# Patient Record
Sex: Female | Born: 1975 | Race: White | Hispanic: No | Marital: Married | Smoking: Former smoker
Health system: Southern US, Community
[De-identification: ages and names within clinical notes are randomized; demographics above are authoritative.]

## PROBLEM LIST (undated history)

## (undated) DIAGNOSIS — R51 Headache: Secondary | ICD-10-CM

## (undated) DIAGNOSIS — G4730 Sleep apnea, unspecified: Secondary | ICD-10-CM

## (undated) DIAGNOSIS — G8929 Other chronic pain: Secondary | ICD-10-CM

## (undated) DIAGNOSIS — M545 Low back pain, unspecified: Secondary | ICD-10-CM

## (undated) DIAGNOSIS — I82409 Acute embolism and thrombosis of unspecified deep veins of unspecified lower extremity: Secondary | ICD-10-CM

## (undated) DIAGNOSIS — K219 Gastro-esophageal reflux disease without esophagitis: Secondary | ICD-10-CM

## (undated) DIAGNOSIS — K754 Autoimmune hepatitis: Secondary | ICD-10-CM

## (undated) DIAGNOSIS — D6861 Antiphospholipid syndrome: Secondary | ICD-10-CM

## (undated) DIAGNOSIS — N926 Irregular menstruation, unspecified: Secondary | ICD-10-CM

## (undated) HISTORY — DX: Irregular menstruation, unspecified: N92.6

## (undated) HISTORY — DX: Sleep apnea, unspecified: G47.30

## (undated) HISTORY — DX: Headache: R51

## (undated) HISTORY — DX: Other chronic pain: G89.29

## (undated) HISTORY — DX: Low back pain: M54.5

## (undated) HISTORY — DX: Morbid (severe) obesity due to excess calories: E66.01

## (undated) HISTORY — DX: Autoimmune hepatitis: K75.4

## (undated) HISTORY — DX: Gastro-esophageal reflux disease without esophagitis: K21.9

## (undated) HISTORY — DX: Antiphospholipid syndrome: D68.61

## (undated) HISTORY — DX: Acute embolism and thrombosis of unspecified deep veins of unspecified lower extremity: I82.409

## (undated) HISTORY — DX: Low back pain, unspecified: M54.50

---

## 1999-08-27 ENCOUNTER — Emergency Department (HOSPITAL_COMMUNITY): Admission: EM | Admit: 1999-08-27 | Discharge: 1999-08-27 | Payer: Self-pay

## 2000-01-30 ENCOUNTER — Emergency Department (HOSPITAL_COMMUNITY): Admission: EM | Admit: 2000-01-30 | Discharge: 2000-01-31 | Payer: Self-pay | Admitting: Internal Medicine

## 2000-04-03 ENCOUNTER — Other Ambulatory Visit: Admission: RE | Admit: 2000-04-03 | Discharge: 2000-04-03 | Payer: Self-pay | Admitting: Obstetrics and Gynecology

## 2000-04-04 ENCOUNTER — Other Ambulatory Visit: Admission: RE | Admit: 2000-04-04 | Discharge: 2000-04-04 | Payer: Self-pay | Admitting: Obstetrics and Gynecology

## 2000-04-04 ENCOUNTER — Encounter (INDEPENDENT_AMBULATORY_CARE_PROVIDER_SITE_OTHER): Payer: Self-pay

## 2000-06-27 ENCOUNTER — Encounter: Admission: RE | Admit: 2000-06-27 | Discharge: 2000-06-27 | Payer: Self-pay | Admitting: Gastroenterology

## 2000-06-27 ENCOUNTER — Encounter: Payer: Self-pay | Admitting: Gastroenterology

## 2000-08-05 ENCOUNTER — Ambulatory Visit (HOSPITAL_COMMUNITY): Admission: RE | Admit: 2000-08-05 | Discharge: 2000-08-05 | Payer: Self-pay | Admitting: Gastroenterology

## 2000-08-23 ENCOUNTER — Ambulatory Visit (HOSPITAL_COMMUNITY): Admission: RE | Admit: 2000-08-23 | Discharge: 2000-08-23 | Payer: Self-pay | Admitting: Gastroenterology

## 2000-08-23 ENCOUNTER — Encounter (INDEPENDENT_AMBULATORY_CARE_PROVIDER_SITE_OTHER): Payer: Self-pay | Admitting: *Deleted

## 2000-08-23 ENCOUNTER — Encounter: Payer: Self-pay | Admitting: Gastroenterology

## 2000-12-06 ENCOUNTER — Encounter: Admission: RE | Admit: 2000-12-06 | Discharge: 2000-12-06 | Payer: Self-pay | Admitting: Gastroenterology

## 2000-12-06 ENCOUNTER — Encounter: Payer: Self-pay | Admitting: Gastroenterology

## 2001-03-26 ENCOUNTER — Other Ambulatory Visit: Admission: RE | Admit: 2001-03-26 | Discharge: 2001-03-26 | Payer: Self-pay | Admitting: Obstetrics and Gynecology

## 2002-04-28 ENCOUNTER — Other Ambulatory Visit: Admission: RE | Admit: 2002-04-28 | Discharge: 2002-04-28 | Payer: Self-pay | Admitting: Obstetrics and Gynecology

## 2003-02-09 ENCOUNTER — Other Ambulatory Visit: Admission: RE | Admit: 2003-02-09 | Discharge: 2003-02-09 | Payer: Self-pay | Admitting: Obstetrics and Gynecology

## 2003-08-02 ENCOUNTER — Encounter: Payer: Self-pay | Admitting: Internal Medicine

## 2003-08-02 ENCOUNTER — Encounter: Admission: RE | Admit: 2003-08-02 | Discharge: 2003-08-02 | Payer: Self-pay | Admitting: Internal Medicine

## 2003-08-18 ENCOUNTER — Encounter: Payer: Self-pay | Admitting: Gastroenterology

## 2003-08-18 ENCOUNTER — Encounter: Admission: RE | Admit: 2003-08-18 | Discharge: 2003-08-18 | Payer: Self-pay | Admitting: Gastroenterology

## 2003-09-30 ENCOUNTER — Encounter: Payer: Self-pay | Admitting: Family Medicine

## 2003-09-30 ENCOUNTER — Encounter: Admission: RE | Admit: 2003-09-30 | Discharge: 2003-09-30 | Payer: Self-pay | Admitting: Family Medicine

## 2004-07-17 ENCOUNTER — Inpatient Hospital Stay (HOSPITAL_COMMUNITY): Admission: EM | Admit: 2004-07-17 | Discharge: 2004-07-19 | Payer: Self-pay | Admitting: Internal Medicine

## 2004-11-15 ENCOUNTER — Ambulatory Visit: Payer: Self-pay | Admitting: Internal Medicine

## 2005-01-19 ENCOUNTER — Ambulatory Visit: Payer: Self-pay | Admitting: Internal Medicine

## 2005-03-16 ENCOUNTER — Ambulatory Visit: Payer: Self-pay | Admitting: Oncology

## 2005-03-19 ENCOUNTER — Ambulatory Visit: Payer: Self-pay | Admitting: Internal Medicine

## 2005-03-26 ENCOUNTER — Ambulatory Visit: Payer: Self-pay | Admitting: Internal Medicine

## 2005-06-01 ENCOUNTER — Ambulatory Visit: Payer: Self-pay | Admitting: Internal Medicine

## 2005-06-08 ENCOUNTER — Ambulatory Visit: Payer: Self-pay | Admitting: Internal Medicine

## 2005-06-11 ENCOUNTER — Ambulatory Visit: Payer: Self-pay | Admitting: Oncology

## 2005-06-13 ENCOUNTER — Other Ambulatory Visit: Admission: RE | Admit: 2005-06-13 | Discharge: 2005-06-13 | Payer: Self-pay | Admitting: Obstetrics and Gynecology

## 2005-09-10 ENCOUNTER — Ambulatory Visit: Payer: Self-pay | Admitting: Internal Medicine

## 2005-09-11 ENCOUNTER — Encounter: Admission: RE | Admit: 2005-09-11 | Discharge: 2005-09-11 | Payer: Self-pay | Admitting: Family Medicine

## 2005-09-21 ENCOUNTER — Ambulatory Visit: Payer: Self-pay | Admitting: Oncology

## 2005-11-12 ENCOUNTER — Ambulatory Visit: Payer: Self-pay | Admitting: Internal Medicine

## 2005-11-27 ENCOUNTER — Encounter: Admission: RE | Admit: 2005-11-27 | Discharge: 2005-11-27 | Payer: Self-pay | Admitting: Gastroenterology

## 2005-12-19 ENCOUNTER — Ambulatory Visit: Payer: Self-pay | Admitting: Internal Medicine

## 2006-03-06 ENCOUNTER — Ambulatory Visit: Payer: Self-pay | Admitting: Internal Medicine

## 2006-03-15 ENCOUNTER — Encounter: Admission: RE | Admit: 2006-03-15 | Discharge: 2006-03-15 | Payer: Self-pay | Admitting: Internal Medicine

## 2006-03-27 ENCOUNTER — Ambulatory Visit: Payer: Self-pay | Admitting: Internal Medicine

## 2006-04-08 ENCOUNTER — Ambulatory Visit: Payer: Self-pay | Admitting: Oncology

## 2006-04-15 LAB — CBC WITH DIFFERENTIAL/PLATELET
BASO%: 0.7 % (ref 0.0–2.0)
EOS%: 1.6 % (ref 0.0–7.0)
HCT: 38.5 % (ref 34.8–46.6)
MCH: 29.1 pg (ref 26.0–34.0)
MCHC: 34 g/dL (ref 32.0–36.0)
MONO#: 0.6 10*3/uL (ref 0.1–0.9)
NEUT%: 66.6 % (ref 39.6–76.8)
RDW: 16.4 % — ABNORMAL HIGH (ref 11.3–14.5)
WBC: 6.5 10*3/uL (ref 3.9–10.0)
lymph#: 1.5 10*3/uL (ref 0.9–3.3)

## 2006-04-15 LAB — IRON AND TIBC
TIBC: 345 ug/dL (ref 250–470)
UIBC: 315 ug/dL

## 2006-07-08 ENCOUNTER — Ambulatory Visit: Payer: Self-pay | Admitting: Internal Medicine

## 2006-07-16 ENCOUNTER — Ambulatory Visit: Payer: Self-pay | Admitting: Family Medicine

## 2006-07-22 ENCOUNTER — Ambulatory Visit: Payer: Self-pay | Admitting: Internal Medicine

## 2006-09-24 ENCOUNTER — Ambulatory Visit: Payer: Self-pay | Admitting: Internal Medicine

## 2006-10-04 ENCOUNTER — Ambulatory Visit: Payer: Self-pay | Admitting: Oncology

## 2006-10-14 ENCOUNTER — Ambulatory Visit: Payer: Self-pay | Admitting: Cardiology

## 2006-10-16 ENCOUNTER — Ambulatory Visit: Payer: Self-pay | Admitting: Internal Medicine

## 2006-10-21 ENCOUNTER — Ambulatory Visit: Payer: Self-pay | Admitting: Internal Medicine

## 2006-10-22 ENCOUNTER — Ambulatory Visit: Payer: Self-pay | Admitting: Internal Medicine

## 2006-10-24 ENCOUNTER — Ambulatory Visit: Payer: Self-pay | Admitting: Internal Medicine

## 2006-10-28 ENCOUNTER — Ambulatory Visit: Payer: Self-pay | Admitting: Internal Medicine

## 2007-01-12 DIAGNOSIS — D6861 Antiphospholipid syndrome: Secondary | ICD-10-CM

## 2007-01-12 DIAGNOSIS — K219 Gastro-esophageal reflux disease without esophagitis: Secondary | ICD-10-CM

## 2007-06-02 ENCOUNTER — Ambulatory Visit: Payer: Self-pay | Admitting: Internal Medicine

## 2007-06-02 LAB — CONVERTED CEMR LAB: INR: 1.7

## 2007-06-16 ENCOUNTER — Ambulatory Visit: Payer: Self-pay | Admitting: Internal Medicine

## 2007-06-16 LAB — CONVERTED CEMR LAB: INR: 5.5

## 2007-06-26 ENCOUNTER — Ambulatory Visit: Payer: Self-pay | Admitting: Internal Medicine

## 2007-06-26 DIAGNOSIS — I82409 Acute embolism and thrombosis of unspecified deep veins of unspecified lower extremity: Secondary | ICD-10-CM

## 2007-08-29 ENCOUNTER — Telehealth (INDEPENDENT_AMBULATORY_CARE_PROVIDER_SITE_OTHER): Payer: Self-pay | Admitting: *Deleted

## 2007-12-05 ENCOUNTER — Ambulatory Visit: Payer: Self-pay | Admitting: Internal Medicine

## 2007-12-05 LAB — CONVERTED CEMR LAB: INR: 1.2

## 2007-12-16 ENCOUNTER — Ambulatory Visit: Payer: Self-pay | Admitting: Internal Medicine

## 2007-12-16 LAB — CONVERTED CEMR LAB: INR: 2.6

## 2008-01-02 ENCOUNTER — Ambulatory Visit: Payer: Self-pay | Admitting: Internal Medicine

## 2008-01-16 ENCOUNTER — Ambulatory Visit: Payer: Self-pay | Admitting: Internal Medicine

## 2008-02-13 ENCOUNTER — Telehealth (INDEPENDENT_AMBULATORY_CARE_PROVIDER_SITE_OTHER): Payer: Self-pay | Admitting: *Deleted

## 2008-02-27 ENCOUNTER — Ambulatory Visit: Payer: Self-pay | Admitting: Internal Medicine

## 2008-03-08 ENCOUNTER — Telehealth (INDEPENDENT_AMBULATORY_CARE_PROVIDER_SITE_OTHER): Payer: Self-pay | Admitting: *Deleted

## 2008-03-23 ENCOUNTER — Ambulatory Visit: Payer: Self-pay | Admitting: Internal Medicine

## 2008-03-25 ENCOUNTER — Telehealth (INDEPENDENT_AMBULATORY_CARE_PROVIDER_SITE_OTHER): Payer: Self-pay | Admitting: *Deleted

## 2008-03-25 LAB — CONVERTED CEMR LAB: INR: 1.2 — ABNORMAL HIGH (ref 0.8–1.0)

## 2008-04-01 ENCOUNTER — Ambulatory Visit: Payer: Self-pay | Admitting: Internal Medicine

## 2008-04-02 ENCOUNTER — Telehealth (INDEPENDENT_AMBULATORY_CARE_PROVIDER_SITE_OTHER): Payer: Self-pay | Admitting: *Deleted

## 2008-04-04 LAB — CONVERTED CEMR LAB
INR: 3.3 — ABNORMAL HIGH (ref 0.8–1.0)
Prothrombin Time: 23.3 s — ABNORMAL HIGH (ref 10.9–13.3)

## 2008-04-13 ENCOUNTER — Ambulatory Visit: Payer: Self-pay | Admitting: Internal Medicine

## 2008-04-15 ENCOUNTER — Telehealth (INDEPENDENT_AMBULATORY_CARE_PROVIDER_SITE_OTHER): Payer: Self-pay | Admitting: *Deleted

## 2008-04-15 LAB — CONVERTED CEMR LAB: Prothrombin Time: 20.9 s — ABNORMAL HIGH (ref 11.6–15.2)

## 2008-04-26 ENCOUNTER — Ambulatory Visit: Payer: Self-pay | Admitting: Internal Medicine

## 2008-04-27 ENCOUNTER — Telehealth (INDEPENDENT_AMBULATORY_CARE_PROVIDER_SITE_OTHER): Payer: Self-pay | Admitting: *Deleted

## 2008-04-27 LAB — CONVERTED CEMR LAB: INR: 7.8 (ref 0.8–1.0)

## 2008-04-30 ENCOUNTER — Telehealth (INDEPENDENT_AMBULATORY_CARE_PROVIDER_SITE_OTHER): Payer: Self-pay | Admitting: *Deleted

## 2008-04-30 ENCOUNTER — Encounter (INDEPENDENT_AMBULATORY_CARE_PROVIDER_SITE_OTHER): Payer: Self-pay | Admitting: *Deleted

## 2008-04-30 ENCOUNTER — Ambulatory Visit: Payer: Self-pay | Admitting: Internal Medicine

## 2008-04-30 LAB — CONVERTED CEMR LAB
INR: 1.3 — ABNORMAL HIGH (ref 0.8–1.0)
Prothrombin Time: 15 s — ABNORMAL HIGH (ref 10.9–13.3)

## 2008-05-07 ENCOUNTER — Encounter (INDEPENDENT_AMBULATORY_CARE_PROVIDER_SITE_OTHER): Payer: Self-pay | Admitting: *Deleted

## 2008-05-07 ENCOUNTER — Ambulatory Visit: Payer: Self-pay | Admitting: Cardiology

## 2008-05-07 ENCOUNTER — Telehealth (INDEPENDENT_AMBULATORY_CARE_PROVIDER_SITE_OTHER): Payer: Self-pay | Admitting: *Deleted

## 2008-05-21 ENCOUNTER — Telehealth (INDEPENDENT_AMBULATORY_CARE_PROVIDER_SITE_OTHER): Payer: Self-pay | Admitting: *Deleted

## 2008-05-21 ENCOUNTER — Ambulatory Visit: Payer: Self-pay | Admitting: Internal Medicine

## 2008-05-21 LAB — CONVERTED CEMR LAB
INR: 1.8 — ABNORMAL HIGH (ref 0.8–1.0)
Prothrombin Time: 20.3 s — ABNORMAL HIGH (ref 10.9–13.3)

## 2008-10-01 ENCOUNTER — Telehealth (INDEPENDENT_AMBULATORY_CARE_PROVIDER_SITE_OTHER): Payer: Self-pay | Admitting: *Deleted

## 2008-10-11 ENCOUNTER — Ambulatory Visit: Payer: Self-pay | Admitting: Internal Medicine

## 2008-10-12 ENCOUNTER — Telehealth (INDEPENDENT_AMBULATORY_CARE_PROVIDER_SITE_OTHER): Payer: Self-pay | Admitting: *Deleted

## 2008-10-12 LAB — CONVERTED CEMR LAB
INR: 3.6 — ABNORMAL HIGH (ref 0.8–1.0)
Prothrombin Time: 37.4 s — ABNORMAL HIGH (ref 10.9–13.3)

## 2008-10-27 ENCOUNTER — Ambulatory Visit: Payer: Self-pay | Admitting: Internal Medicine

## 2008-10-29 ENCOUNTER — Telehealth: Payer: Self-pay | Admitting: Internal Medicine

## 2008-10-29 LAB — CONVERTED CEMR LAB: Prothrombin Time: 22.7 s — ABNORMAL HIGH (ref 10.9–13.3)

## 2008-11-13 ENCOUNTER — Inpatient Hospital Stay (HOSPITAL_COMMUNITY): Admission: AD | Admit: 2008-11-13 | Discharge: 2008-11-13 | Payer: Self-pay | Admitting: Obstetrics and Gynecology

## 2008-11-15 ENCOUNTER — Telehealth (INDEPENDENT_AMBULATORY_CARE_PROVIDER_SITE_OTHER): Payer: Self-pay | Admitting: *Deleted

## 2008-11-18 ENCOUNTER — Ambulatory Visit: Payer: Self-pay | Admitting: Internal Medicine

## 2008-11-18 ENCOUNTER — Telehealth: Payer: Self-pay | Admitting: Family Medicine

## 2008-11-19 ENCOUNTER — Telehealth: Payer: Self-pay | Admitting: Internal Medicine

## 2008-11-19 LAB — CONVERTED CEMR LAB
INR: 1.2 — ABNORMAL HIGH (ref 0.8–1.0)
Prothrombin Time: 14.5 s — ABNORMAL HIGH (ref 10.9–13.3)

## 2008-11-30 HISTORY — PX: DILATION AND CURETTAGE OF UTERUS: SHX78

## 2008-12-03 ENCOUNTER — Encounter (INDEPENDENT_AMBULATORY_CARE_PROVIDER_SITE_OTHER): Payer: Self-pay | Admitting: Obstetrics and Gynecology

## 2008-12-03 ENCOUNTER — Ambulatory Visit: Payer: Self-pay | Admitting: Internal Medicine

## 2008-12-03 ENCOUNTER — Ambulatory Visit (HOSPITAL_COMMUNITY): Admission: RE | Admit: 2008-12-03 | Discharge: 2008-12-03 | Payer: Self-pay | Admitting: Obstetrics and Gynecology

## 2009-01-05 ENCOUNTER — Telehealth (INDEPENDENT_AMBULATORY_CARE_PROVIDER_SITE_OTHER): Payer: Self-pay | Admitting: *Deleted

## 2009-01-14 ENCOUNTER — Ambulatory Visit: Payer: Self-pay | Admitting: Internal Medicine

## 2009-01-14 ENCOUNTER — Telehealth (INDEPENDENT_AMBULATORY_CARE_PROVIDER_SITE_OTHER): Payer: Self-pay | Admitting: *Deleted

## 2009-01-14 ENCOUNTER — Encounter: Admission: RE | Admit: 2009-01-14 | Discharge: 2009-01-14 | Payer: Self-pay | Admitting: Gastroenterology

## 2009-01-17 ENCOUNTER — Telehealth: Payer: Self-pay | Admitting: Internal Medicine

## 2009-01-18 ENCOUNTER — Telehealth (INDEPENDENT_AMBULATORY_CARE_PROVIDER_SITE_OTHER): Payer: Self-pay | Admitting: *Deleted

## 2009-01-21 ENCOUNTER — Ambulatory Visit: Payer: Self-pay | Admitting: Internal Medicine

## 2009-01-21 DIAGNOSIS — M549 Dorsalgia, unspecified: Secondary | ICD-10-CM | POA: Insufficient documentation

## 2009-01-28 ENCOUNTER — Encounter: Payer: Self-pay | Admitting: Internal Medicine

## 2009-02-01 ENCOUNTER — Encounter: Payer: Self-pay | Admitting: Internal Medicine

## 2009-02-01 ENCOUNTER — Telehealth: Payer: Self-pay | Admitting: Internal Medicine

## 2009-02-04 ENCOUNTER — Encounter: Payer: Self-pay | Admitting: Internal Medicine

## 2009-02-04 ENCOUNTER — Encounter: Admission: RE | Admit: 2009-02-04 | Discharge: 2009-02-04 | Payer: Self-pay | Admitting: Gastroenterology

## 2009-02-25 ENCOUNTER — Encounter: Payer: Self-pay | Admitting: Internal Medicine

## 2009-03-02 ENCOUNTER — Telehealth: Payer: Self-pay | Admitting: Internal Medicine

## 2009-03-04 ENCOUNTER — Ambulatory Visit: Payer: Self-pay | Admitting: Internal Medicine

## 2009-03-04 ENCOUNTER — Telehealth (INDEPENDENT_AMBULATORY_CARE_PROVIDER_SITE_OTHER): Payer: Self-pay | Admitting: *Deleted

## 2009-03-04 LAB — CONVERTED CEMR LAB: Prothrombin Time: 16.2 s — ABNORMAL HIGH (ref 10.9–13.3)

## 2009-03-10 ENCOUNTER — Telehealth (INDEPENDENT_AMBULATORY_CARE_PROVIDER_SITE_OTHER): Payer: Self-pay | Admitting: *Deleted

## 2009-03-18 ENCOUNTER — Ambulatory Visit: Payer: Self-pay | Admitting: Internal Medicine

## 2009-03-18 LAB — CONVERTED CEMR LAB: INR: 4.2

## 2009-04-22 ENCOUNTER — Ambulatory Visit: Payer: Self-pay | Admitting: Internal Medicine

## 2009-04-22 LAB — CONVERTED CEMR LAB: INR: 3.5

## 2009-05-13 ENCOUNTER — Ambulatory Visit: Payer: Self-pay | Admitting: Internal Medicine

## 2009-05-13 LAB — CONVERTED CEMR LAB
INR: 3.6
Prothrombin Time: 22.8 s

## 2009-05-23 ENCOUNTER — Telehealth: Payer: Self-pay | Admitting: Internal Medicine

## 2009-05-23 ENCOUNTER — Encounter (INDEPENDENT_AMBULATORY_CARE_PROVIDER_SITE_OTHER): Payer: Self-pay | Admitting: *Deleted

## 2009-05-27 ENCOUNTER — Ambulatory Visit: Payer: Self-pay | Admitting: Internal Medicine

## 2009-05-30 ENCOUNTER — Ambulatory Visit: Payer: Self-pay | Admitting: Diagnostic Radiology

## 2009-05-30 ENCOUNTER — Encounter: Payer: Self-pay | Admitting: Emergency Medicine

## 2009-05-30 ENCOUNTER — Telehealth: Payer: Self-pay | Admitting: Family Medicine

## 2009-05-30 ENCOUNTER — Ambulatory Visit: Payer: Self-pay | Admitting: Endocrinology

## 2009-05-30 ENCOUNTER — Inpatient Hospital Stay (HOSPITAL_COMMUNITY): Admission: EM | Admit: 2009-05-30 | Discharge: 2009-06-01 | Payer: Self-pay | Admitting: Internal Medicine

## 2009-05-31 ENCOUNTER — Encounter (INDEPENDENT_AMBULATORY_CARE_PROVIDER_SITE_OTHER): Payer: Self-pay | Admitting: Internal Medicine

## 2009-05-31 ENCOUNTER — Ambulatory Visit: Payer: Self-pay | Admitting: Vascular Surgery

## 2009-05-31 ENCOUNTER — Telehealth: Payer: Self-pay | Admitting: Endocrinology

## 2009-05-31 ENCOUNTER — Encounter: Payer: Self-pay | Admitting: Endocrinology

## 2009-06-01 ENCOUNTER — Telehealth (INDEPENDENT_AMBULATORY_CARE_PROVIDER_SITE_OTHER): Payer: Self-pay | Admitting: *Deleted

## 2009-06-01 ENCOUNTER — Encounter: Payer: Self-pay | Admitting: Endocrinology

## 2009-06-03 ENCOUNTER — Ambulatory Visit: Payer: Self-pay | Admitting: Internal Medicine

## 2009-06-03 LAB — CONVERTED CEMR LAB: Prothrombin Time: 16.4 s

## 2009-06-10 ENCOUNTER — Ambulatory Visit: Payer: Self-pay | Admitting: Internal Medicine

## 2009-06-17 ENCOUNTER — Encounter: Payer: Self-pay | Admitting: Internal Medicine

## 2009-06-28 ENCOUNTER — Encounter: Payer: Self-pay | Admitting: Internal Medicine

## 2009-07-01 ENCOUNTER — Ambulatory Visit: Payer: Self-pay | Admitting: Internal Medicine

## 2009-07-01 LAB — CONVERTED CEMR LAB
INR: 3.1
Prothrombin Time: 21.2 s

## 2009-07-06 ENCOUNTER — Encounter: Payer: Self-pay | Admitting: Internal Medicine

## 2009-07-08 ENCOUNTER — Encounter: Payer: Self-pay | Admitting: Internal Medicine

## 2009-07-29 ENCOUNTER — Ambulatory Visit: Payer: Self-pay | Admitting: Internal Medicine

## 2009-07-29 DIAGNOSIS — R5381 Other malaise: Secondary | ICD-10-CM | POA: Insufficient documentation

## 2009-07-29 DIAGNOSIS — R079 Chest pain, unspecified: Secondary | ICD-10-CM

## 2009-07-29 DIAGNOSIS — R5383 Other fatigue: Secondary | ICD-10-CM

## 2009-07-29 LAB — CONVERTED CEMR LAB: Beta hcg, urine, semiquantitative: NEGATIVE

## 2009-08-12 ENCOUNTER — Telehealth: Payer: Self-pay | Admitting: Internal Medicine

## 2009-08-12 ENCOUNTER — Encounter: Payer: Self-pay | Admitting: Cardiology

## 2009-08-15 ENCOUNTER — Encounter (INDEPENDENT_AMBULATORY_CARE_PROVIDER_SITE_OTHER): Payer: Self-pay | Admitting: *Deleted

## 2009-08-22 ENCOUNTER — Encounter: Payer: Self-pay | Admitting: Internal Medicine

## 2009-08-26 ENCOUNTER — Ambulatory Visit: Payer: Self-pay | Admitting: Internal Medicine

## 2009-08-29 ENCOUNTER — Encounter: Payer: Self-pay | Admitting: Internal Medicine

## 2009-09-12 ENCOUNTER — Encounter: Payer: Self-pay | Admitting: Internal Medicine

## 2009-09-16 ENCOUNTER — Ambulatory Visit: Payer: Self-pay | Admitting: Internal Medicine

## 2009-09-16 LAB — CONVERTED CEMR LAB: INR: 3.1

## 2009-09-30 ENCOUNTER — Ambulatory Visit: Payer: Self-pay | Admitting: Internal Medicine

## 2009-09-30 LAB — CONVERTED CEMR LAB
INR: 4.4
Prothrombin Time: 25.5 s

## 2009-11-04 ENCOUNTER — Ambulatory Visit: Payer: Self-pay | Admitting: Internal Medicine

## 2009-11-04 LAB — CONVERTED CEMR LAB: INR: 4.3

## 2009-11-23 ENCOUNTER — Ambulatory Visit: Payer: Self-pay | Admitting: Internal Medicine

## 2009-11-23 LAB — CONVERTED CEMR LAB: Prothrombin Time: 16.4 s

## 2009-12-26 ENCOUNTER — Ambulatory Visit: Payer: Self-pay | Admitting: Internal Medicine

## 2009-12-27 ENCOUNTER — Telehealth (INDEPENDENT_AMBULATORY_CARE_PROVIDER_SITE_OTHER): Payer: Self-pay | Admitting: *Deleted

## 2010-01-02 ENCOUNTER — Ambulatory Visit: Payer: Self-pay | Admitting: Internal Medicine

## 2010-01-02 LAB — CONVERTED CEMR LAB
INR: 4.2
Prothrombin Time: 24.7 s

## 2010-01-16 ENCOUNTER — Ambulatory Visit: Payer: Self-pay | Admitting: Internal Medicine

## 2010-01-20 ENCOUNTER — Ambulatory Visit: Payer: Self-pay | Admitting: Cardiology

## 2010-01-27 ENCOUNTER — Ambulatory Visit: Payer: Self-pay | Admitting: Internal Medicine

## 2010-01-27 LAB — CONVERTED CEMR LAB: POC INR: 1.8

## 2010-02-03 ENCOUNTER — Ambulatory Visit: Payer: Self-pay | Admitting: Internal Medicine

## 2010-02-03 LAB — CONVERTED CEMR LAB: POC INR: 3.6

## 2010-02-10 ENCOUNTER — Ambulatory Visit: Payer: Self-pay | Admitting: Internal Medicine

## 2010-02-10 LAB — CONVERTED CEMR LAB: POC INR: 1.8

## 2010-02-24 ENCOUNTER — Ambulatory Visit: Payer: Self-pay | Admitting: Cardiology

## 2010-02-24 LAB — CONVERTED CEMR LAB: POC INR: 2.1

## 2010-03-08 ENCOUNTER — Telehealth: Payer: Self-pay | Admitting: Internal Medicine

## 2010-03-27 ENCOUNTER — Telehealth (INDEPENDENT_AMBULATORY_CARE_PROVIDER_SITE_OTHER): Payer: Self-pay | Admitting: *Deleted

## 2010-03-31 ENCOUNTER — Ambulatory Visit: Payer: Self-pay | Admitting: Internal Medicine

## 2010-04-28 ENCOUNTER — Ambulatory Visit: Payer: Self-pay | Admitting: Cardiology

## 2010-05-15 ENCOUNTER — Ambulatory Visit: Payer: Self-pay | Admitting: Cardiovascular Disease

## 2010-05-15 LAB — CONVERTED CEMR LAB: POC INR: 2.6

## 2010-06-02 ENCOUNTER — Ambulatory Visit: Payer: Self-pay | Admitting: Internal Medicine

## 2010-06-02 ENCOUNTER — Ambulatory Visit: Payer: Self-pay | Admitting: Cardiovascular Disease

## 2010-06-02 DIAGNOSIS — N939 Abnormal uterine and vaginal bleeding, unspecified: Secondary | ICD-10-CM

## 2010-06-02 DIAGNOSIS — N926 Irregular menstruation, unspecified: Secondary | ICD-10-CM | POA: Insufficient documentation

## 2010-06-02 DIAGNOSIS — R109 Unspecified abdominal pain: Secondary | ICD-10-CM | POA: Insufficient documentation

## 2010-06-02 DIAGNOSIS — D649 Anemia, unspecified: Secondary | ICD-10-CM

## 2010-06-02 LAB — CONVERTED CEMR LAB
Basophils Relative: 0.7 % (ref 0.0–3.0)
Blood in Urine, dipstick: NEGATIVE
Eosinophils Relative: 1.9 % (ref 0.0–5.0)
HCT: 29.1 % — ABNORMAL LOW (ref 36.0–46.0)
Ketones, urine, test strip: NEGATIVE
Lymphs Abs: 1.5 10*3/uL (ref 0.7–4.0)
MCV: 83.1 fL (ref 78.0–100.0)
Monocytes Absolute: 0.5 10*3/uL (ref 0.1–1.0)
Neutro Abs: 3.1 10*3/uL (ref 1.4–7.7)
Nitrite: NEGATIVE
Protein, U semiquant: NEGATIVE
RBC: 3.5 M/uL — ABNORMAL LOW (ref 3.87–5.11)
Specific Gravity, Urine: 1.025
Urobilinogen, UA: 0.2
WBC: 5.2 10*3/uL (ref 4.5–10.5)

## 2010-06-05 ENCOUNTER — Ambulatory Visit: Payer: Self-pay | Admitting: Internal Medicine

## 2010-06-05 ENCOUNTER — Telehealth (INDEPENDENT_AMBULATORY_CARE_PROVIDER_SITE_OTHER): Payer: Self-pay | Admitting: *Deleted

## 2010-06-05 ENCOUNTER — Telehealth: Payer: Self-pay | Admitting: Internal Medicine

## 2010-06-05 LAB — CONVERTED CEMR LAB
Eosinophils Relative: 2.4 % (ref 0.0–5.0)
Folate: 9 ng/mL
HCT: 28.8 % — ABNORMAL LOW (ref 36.0–46.0)
INR: 1.6 — ABNORMAL HIGH (ref 0.8–1.0)
Lymphs Abs: 1.3 10*3/uL (ref 0.7–4.0)
Monocytes Relative: 8.6 % (ref 3.0–12.0)
OCCULT 3: POSITIVE
Platelets: 214 10*3/uL (ref 150.0–400.0)
Prothrombin Time: 17.2 s — ABNORMAL HIGH (ref 9.7–11.8)
Vitamin B-12: 774 pg/mL (ref 211–911)
WBC: 5.5 10*3/uL (ref 4.5–10.5)

## 2010-06-06 ENCOUNTER — Encounter: Payer: Self-pay | Admitting: Internal Medicine

## 2010-06-09 ENCOUNTER — Ambulatory Visit: Payer: Self-pay | Admitting: Cardiovascular Disease

## 2010-06-09 ENCOUNTER — Encounter: Payer: Self-pay | Admitting: Cardiovascular Disease

## 2010-06-09 ENCOUNTER — Ambulatory Visit: Payer: Self-pay | Admitting: Internal Medicine

## 2010-06-12 LAB — CONVERTED CEMR LAB
Albumin: 2.7 g/dL — ABNORMAL LOW (ref 3.5–5.2)
Basophils Relative: 0.7 % (ref 0.0–3.0)
Bilirubin, Direct: 0.2 mg/dL (ref 0.0–0.3)
Eosinophils Relative: 1.8 % (ref 0.0–5.0)
Hemoglobin: 10 g/dL — ABNORMAL LOW (ref 12.0–15.0)
Lymphocytes Relative: 25.5 % (ref 12.0–46.0)
Monocytes Relative: 9.6 % (ref 3.0–12.0)
Neutro Abs: 4.4 10*3/uL (ref 1.4–7.7)
RBC: 3.59 M/uL — ABNORMAL LOW (ref 3.87–5.11)
Total Protein: 9.2 g/dL — ABNORMAL HIGH (ref 6.0–8.3)
WBC: 7.1 10*3/uL (ref 4.5–10.5)

## 2010-06-13 ENCOUNTER — Encounter: Admission: RE | Admit: 2010-06-13 | Discharge: 2010-06-13 | Payer: Self-pay | Admitting: Internal Medicine

## 2010-06-16 ENCOUNTER — Ambulatory Visit: Payer: Self-pay | Admitting: Cardiology

## 2010-07-07 ENCOUNTER — Ambulatory Visit: Payer: Self-pay | Admitting: Cardiovascular Disease

## 2010-07-07 LAB — CONVERTED CEMR LAB: POC INR: 2.3

## 2010-07-17 ENCOUNTER — Telehealth: Payer: Self-pay | Admitting: Internal Medicine

## 2010-07-21 ENCOUNTER — Ambulatory Visit (HOSPITAL_COMMUNITY): Admission: RE | Admit: 2010-07-21 | Discharge: 2010-07-21 | Payer: Self-pay | Admitting: Obstetrics and Gynecology

## 2010-08-04 ENCOUNTER — Ambulatory Visit: Payer: Self-pay | Admitting: Cardiovascular Disease

## 2010-08-04 LAB — CONVERTED CEMR LAB: POC INR: 3.2

## 2010-08-25 ENCOUNTER — Ambulatory Visit: Payer: Self-pay | Admitting: Cardiology

## 2010-09-15 ENCOUNTER — Ambulatory Visit: Payer: Self-pay | Admitting: Internal Medicine

## 2010-09-15 ENCOUNTER — Telehealth: Payer: Self-pay | Admitting: Internal Medicine

## 2010-09-15 LAB — CONVERTED CEMR LAB: POC INR: 2.7

## 2010-10-13 ENCOUNTER — Ambulatory Visit: Payer: Self-pay | Admitting: Cardiology

## 2010-10-13 LAB — CONVERTED CEMR LAB: POC INR: 1.5

## 2010-10-24 ENCOUNTER — Telehealth: Payer: Self-pay | Admitting: Internal Medicine

## 2010-10-27 ENCOUNTER — Ambulatory Visit: Payer: Self-pay | Admitting: Cardiovascular Disease

## 2010-11-20 ENCOUNTER — Ambulatory Visit: Payer: Self-pay | Admitting: Cardiovascular Disease

## 2010-11-20 ENCOUNTER — Telehealth: Payer: Self-pay | Admitting: Internal Medicine

## 2010-11-27 ENCOUNTER — Telehealth: Payer: Self-pay | Admitting: Internal Medicine

## 2010-12-08 ENCOUNTER — Telehealth: Payer: Self-pay | Admitting: Internal Medicine

## 2010-12-08 ENCOUNTER — Ambulatory Visit: Payer: Self-pay | Admitting: Internal Medicine

## 2011-01-04 ENCOUNTER — Telehealth: Payer: Self-pay | Admitting: Internal Medicine

## 2011-01-05 ENCOUNTER — Ambulatory Visit: Admission: RE | Admit: 2011-01-05 | Discharge: 2011-01-05 | Payer: Self-pay | Source: Home / Self Care

## 2011-01-05 LAB — CONVERTED CEMR LAB: POC INR: 2.4

## 2011-01-28 LAB — CONVERTED CEMR LAB
AST: 42 units/L
Calcium: 8.6 mg/dL
Glucose, Bld: 111 mg/dL
HCT: 35.3 %
Hemoglobin: 11.5 g/dL
MCV: 83.6 fL
RBC count: 4.23 10*6/uL
Total Protein: 8.5 g/dL
WBC, blood: 6.6 10*3/uL

## 2011-02-01 NOTE — Progress Notes (Signed)
Summary: refill  Phone Note Refill Request Message from:  Patient  Refills Requested: Medication #1:  OXYCODONE HCL 5 MG CAPS as needed   Last Refilled: 05/04/2010  Method Requested: Pick up at Office Initial call taken by: Army Fossa CMA,  July 17, 2010 2:57 PM  Follow-up for Phone Call        ok 60, no RF Kiing Deakin E. Shaydon Lease MD  July 17, 2010 4:11 PM     Prescriptions: OXYCODONE HCL 5 MG CAPS (OXYCODONE HCL) as needed  #60 x 0   Entered by:   Army Fossa CMA   Authorized by:   Nolon Rod. Cobi Delph MD   Signed by:   Army Fossa CMA on 07/17/2010   Method used:   Print then Give to Patient   RxID:   [id number]

## 2011-02-01 NOTE — Progress Notes (Signed)
Summary: PT/INR Results/ Lab Results  Phone Note Outgoing Call Call back at Home Phone [phone]   Call placed by: Shonna Chock,  June 05, 2010 4:42 PM Call placed to: Patient Summary of Call: Patient left messag on VM requesting lab results because she was holding coumadin over the weekend and needs to know what to do. I reviewed chart and no results avaliable.  I called [facility] and spoke with [facility] and she said they are still running patient's labs trying to get her b12 level to register. I asked if the PT/INR was avaliable. Hope said the PT/INR was 17.2/1.6  Follow-up for Phone Call        Per Dr.Hopper have patient take a 5mg  tab tonight and futher instruction will follow once other labs avaliable Follow-up by: Shonna Chock,  June 05, 2010 4:53 PM  Additional Follow-up for Phone Call Additional follow up Details #1::        Anemia is stable ; surprisingly the iron levels are norma.Stool cards are positive; GI consult indicated. INR 1.6; stay on 5 mg & recheck PT/INR 6/10 with CBC.Gyn assessment of the prolonged menses should be completed in addition to GI evaluation. Additional Follow-up by: [name] MD,  June 06, 2010 5:40 AM    Additional Follow-up for Phone Call Additional follow up Details #2::    DISCUSS WITH PATIENT appt schedule to recheck CBC and pt/inr............Marland KitchenFelecia Deloach CMA  June 06, 2010 8:27 AM   please schedule a GI consult, DX positive Hemoccults, patient on Coumadin Jose E. Paz MD  June 07, 2010 4:31 PM   PT ALREADY SCHEDULE FOR GI CONSULT ON 06-06-10..................Marland KitchenFelecia Deloach CMA  June 07, 2010 4:45 PM   Prescriptions: COUMADIN 5 MG TABS (WARFARIN SODIUM) as directed Brand medically necessary #135 x 0   Entered by:   Shonna Chock   Authorized by:   [name] MD   Signed by:   Shonna Chock on 06/05/2010   Method used:   Electronically to        [facility] [phone]* (retail)       [address]       [place], [place]  [address]       Ph: [phone]       Fax: [phone]   RxID:   [id number]

## 2011-02-01 NOTE — Medication Information (Signed)
Summary: rov/ln  Anticoagulant Therapy  Managed by: Bethena Midget, RN, BSN PCP: Drue Novel MD, Leane Platt MD: Eden Emms MD, Theron Arista Indication 1: Deep venous thrombosis Lab Used: [facility] [place] Site: [address] INR POC 3.2 INR RANGE 2.0-3.0  Dietary changes: no    Health status changes: no    Bleeding/hemorrhagic complications: no    Recent/future hospitalizations: no    Any changes in medication regimen? no    Recent/future dental: no  Any missed doses?: no       Is patient compliant with meds? yes       Allergies: No Known Drug Allergies  Anticoagulation Management History:      The patient is taking warfarin and comes in today for a routine follow up visit.  Positive risk factors for bleeding include presence of serious comorbidities.  Negative risk factors for bleeding include an age less than 65 years old.  The bleeding index is 'intermediate risk'.  Negative CHADS2 values include Age > 11 years old.  Her last INR was 1.4.  Anticoagulation responsible provider: Eden Emms MD, Theron Arista.  INR POC: 3.2.  Cuvette Lot#: [id number].  Exp: 10/2011.    Anticoagulation Management Assessment/Plan:      The patient's current anticoagulation dose is Coumadin 5 mg tabs: as directed.  The target INR is 2.0-3.0.  The next INR is due 08/25/2010.  Anticoagulation instructions were given to patient.  Results were reviewed/authorized by Bethena Midget, RN, BSN.  She was notified by Bethena Midget, RN, BSN.         Prior Anticoagulation Instructions: INR 2.3  Continue same regimen of 1.5 tabs every day.  Recheck in 4 weeks.     Current Anticoagulation Instructions: INR 3.2  Skip today then change dose to 7.5mg s daily except 5mg s on Wednesdays.  Recheck in 3 weeks.

## 2011-02-01 NOTE — Medication Information (Signed)
Summary: rov/sp  Anticoagulant Therapy  Managed by: Elaina Pattee, PharmD PCP: Drue Novel MD, Leane Platt MD: Juanda Chance MD, Pamila Mendibles Indication 1: Deep venous thrombosis Lab Used: [facility] [place] Site: [address] INR POC 2.1 INR RANGE 2.0-3.0  Dietary changes: no    Health status changes: no    Bleeding/hemorrhagic complications: yes       Details: Blood noted in stool after BM, which is described as a few drops.    Recent/future hospitalizations: no    Any changes in medication regimen? no    Recent/future dental: no  Any missed doses?: no       Is patient compliant with meds? yes      Comments: No bleeding source has been identified.  Allergies: No Known Drug Allergies  Anticoagulation Management History:      The patient is taking warfarin and comes in today for a routine follow up visit.  Positive risk factors for bleeding include presence of serious comorbidities.  Negative risk factors for bleeding include an age less than 39 years old.  The bleeding index is 'intermediate risk'.  Negative CHADS2 values include Age > 38 years old.  Her last INR was 1.4.  Anticoagulation responsible provider: Juanda Chance MD, Smitty Cords.  INR POC: 2.1.  Cuvette Lot#: [id number].  Exp: 08/2011.    Anticoagulation Management Assessment/Plan:      The patient's current anticoagulation dose is Coumadin 5 mg tabs: as directed.  The target INR is 2.0-3.0.  The next INR is due 06/27/2010.  Anticoagulation instructions were given to patient.  Results were reviewed/authorized by Elaina Pattee, PharmD.  She was notified by Elaina Pattee, PharmD.         Prior Anticoagulation Instructions: INR 1.4  Start new dose of 1 1/2 tablets daily. Repeat INR in 1 week.   Current Anticoagulation Instructions: INR 2.1. Take 1.5 tablets daily. Recheck in 2 weeks.

## 2011-02-01 NOTE — Medication Information (Signed)
Summary: rov/tm  Anticoagulant Therapy  Managed by: Weston Brass, PharmD PCP: Drue Novel MD, Leane Platt MD: Clifton [name] MD, Cristal Deer Indication 1: Deep venous thrombosis Lab Used: [facility] [place] Site: [address] INR POC 2.6 INR RANGE 2.0-3.0  Dietary changes: no    Health status changes: no    Bleeding/hemorrhagic complications: yes       Details: heavy menstrual bleeding more frequent menses than normal.Patient in touch with OBGYN  Recent/future hospitalizations: no    Any changes in medication regimen? no    Recent/future dental: no  Any missed doses?: no       Is patient compliant with meds? yes       Allergies: No Known Drug Allergies  Anticoagulation Management History:      The patient is taking warfarin and comes in today for a routine follow up visit.  Negative risk factors for bleeding include an age less than 3 years old.  The bleeding index is 'low risk'.  Negative CHADS2 values include Age > 24 years old.  Her last INR was 4.2.  Anticoagulation responsible provider: Clifton [name] MD, Cristal Deer.  INR POC: 2.6.  Cuvette Lot#: [id number].  Exp: 08/2011.    Anticoagulation Management Assessment/Plan:      The patient's current anticoagulation dose is Coumadin 5 mg tabs: as directed.  The target INR is 2.0-3.0.  The next INR is due 06/02/2010.  Anticoagulation instructions were given to patient.  Results were reviewed/authorized by Weston Brass, PharmD.  She was notified by Al[facility] Dad [facility].         Prior Anticoagulation Instructions: INR 4.4 Skip today. Then resume 10mg  daily except 7.5mg s on Wednesdays. Recheck in 2 weeks.   Current Anticoagulation Instructions: INR-2.6 Resume normal schedule, Take 1,5 tablets on Wednesdays and 2 tablets on all other days of the week. Return in 3 weeks.

## 2011-02-01 NOTE — Medication Information (Signed)
Summary: ROV/SP  Anticoagulant Therapy  Managed by: Elaina Pattee, PharmD PCP: Drue Novel MD, Leane Platt MD: Excell Seltzer MD, Casimiro Needle Indication 1: Deep venous thrombosis Lab Used: [facility] [place] Site: [address] INR POC 6.6 INR RANGE 2.0-3.0  Dietary changes: no    Health status changes: yes       Details: Complains of dizziness and blurry vision.  Will call PCP.    Bleeding/hemorrhagic complications: yes       Details: Black stools for 3 weeks.  Drawing CBC as well.  Recent/future hospitalizations: no    Any changes in medication regimen? no    Recent/future dental: yes     Details: Pt will have tooth pulled soon.  No need to hold Coumadin.  Any missed doses?: no       Is patient compliant with meds? yes      Comments: Sent pt to lab.  Also drew CBC. Called pt on cell and notified her that her H&H was low and INR=7.6. Advised to contact PCP and be seen today.  She will call back if this is not possible.   Allergies: No Known Drug Allergies  Anticoagulation Management History:      Negative risk factors for bleeding include an age less than 39 years old.  The bleeding index is 'low risk'.  Negative CHADS2 values include Age > 46 years old.  Her last INR was 4.2.  Anticoagulation responsible provider: Excell Seltzer MD, Casimiro Needle.  INR POC: 6.6.  Exp: 08/2011.    Anticoagulation Management Assessment/Plan:      The patient's current anticoagulation dose is Coumadin 5 mg tabs: as directed.  The target INR is 2.0-3.0.  The next INR is due 06/02/2010.  Anticoagulation instructions were given to patient.  Results were reviewed/authorized by Elaina Pattee, PharmD.  She was notified by Elaina Pattee, PharmD.         Prior Anticoagulation Instructions: INR-2.6 Resume normal schedule, Take 1,5 tablets on Wednesdays and 2 tablets on all other days of the week. Return in 3 weeks.  Current Anticoagulation Instructions: INR 6.6. Go to lab to have PT/INR drawn.  Will call this  afternoon with further instructions.  Call at [phone] (cell) or [phone] (work).  Appt Monday for recheck.  Hold Coumadin until then.  12:40 [place]

## 2011-02-01 NOTE — Medication Information (Signed)
Summary: rov/ewj  Anticoagulant Therapy  Managed by: Elaina Pattee, PharmD PCP: Drue Novel MD, Leane Platt MD: Johney Frame MD, Fayrene Fearing Indication 1: Deep venous thrombosis Lab Used: [facility] [place] Site: [address] INR POC 3.6 INR RANGE 2.0-3.0  Dietary changes: no    Health status changes: no    Bleeding/hemorrhagic complications: no    Recent/future hospitalizations: no    Any changes in medication regimen? no    Recent/future dental: no  Any missed doses?: no       Is patient compliant with meds? yes       Allergies: No Known Drug Allergies  Anticoagulation Management History:      The patient is taking warfarin and comes in today for a routine follow up visit.  Negative risk factors for bleeding include an age less than 83 years old.  The bleeding index is 'low risk'.  Negative CHADS2 values include Age > 2 years old.  Her last INR was 4.2.  Anticoagulation responsible provider: Mauricio Dahlen MD, Fayrene Fearing.  INR POC: 3.6.  Cuvette Lot#: [id number].  Exp: 04/2011.    Anticoagulation Management Assessment/Plan:      The patient's current anticoagulation dose is Coumadin 5 mg tabs: as directed.  The target INR is 2.0-3.0.  The next INR is due 02/10/2010.  Anticoagulation instructions were given to patient.  Results were reviewed/authorized by Elaina Pattee, PharmD.  She was notified by Elaina Pattee, PharmD.         Prior Anticoagulation Instructions: INR 1.8  Take 12.5mg  today then start taking 10mg  daily except 7.5mg  on Wednesdays.  Recheck in 1 week.    Current Anticoagulation Instructions: INR 3.6. Hold today, then continue 2 tablets daily except 1.5 on Wed.

## 2011-02-01 NOTE — Progress Notes (Signed)
Summary: Prescribing error  Phone Note Call from Patient Call back at Home Phone [phone]   Summary of Call: I spoke with the patient about Oxy prescription, we did prepare the prescription with the wrong qty and the patient did not notice until she had already picked up the med and returned home. I made her aware to call the office when that prescription has been completed and we can provide the proper qty on next prescription. Patient expressed understanding. Initial call taken by: Lucious Groves CMA,  November 27, 2010 11:36 AM

## 2011-02-01 NOTE — Medication Information (Signed)
Summary: rov/ewj   Anticoagulant Therapy  Managed by: Weston Brass, PharmD PCP: Drue Novel MD, Leane Platt MD: Gala Romney MD, Reuel Boom Indication 1: Deep venous thrombosis Lab Used: [facility] [place] Site: [address] INR POC 2.8 INR RANGE 2.0-3.0  Dietary changes: no    Health status changes: no    Bleeding/hemorrhagic complications: no    Recent/future hospitalizations: no    Any changes in medication regimen? no    Recent/future dental: no  Any missed doses?: no       Is patient compliant with meds? yes       Allergies: No Known Drug Allergies  Anticoagulation Management History:      The patient is taking warfarin and comes in today for a routine follow up visit.  Positive risk factors for bleeding include presence of serious comorbidities.  Negative risk factors for bleeding include an age less than 43 years old.  The bleeding index is 'intermediate risk'.  Negative CHADS2 values include Age > 22 years old.  Her last INR was 1.4.  Anticoagulation responsible Artha Chiasson: Bensimhon MD, Reuel Boom.  INR POC: 2.8.  Cuvette Lot#: [id number].  Exp: 11/2011.    Anticoagulation Management Assessment/Plan:      The patient's current anticoagulation dose is Coumadin 5 mg tabs: as directed.  The target INR is 2.0-3.0.  The next INR is due 11/17/2010.  Anticoagulation instructions were given to patient.  Results were reviewed/authorized by Weston Brass, PharmD.  She was notified by Haynes Hoehn, PharmD Candidate.         Prior Anticoagulation Instructions: INR 1.5  Take 10mg  today and tomorrow, then resume same dosage 1.5 tablets daily except 1 tablet on Wednesdays.  Recheck in 2 weeks.    Current Anticoagulation Instructions: INR 2.8  Continue Coumadin as scheduled:  1.5 tablets every day of the week, except 1 tableton Wednesday.  Return to clinic in 3 weeks.

## 2011-02-01 NOTE — Assessment & Plan Note (Signed)
Summary: pt/inr/swh   Nurse Visit   Allergies: No Known Drug Allergies  Impression & Recommendations:  Problem # 1:  ENCOUNTER FOR THERAPEUTIC DRUG MONITORING (ICD-V58.83) patient unwilling to go to the coumadin clinic will insist the she goes to a coumadin clinic (at [facility] or elsewhere) as I have not been able to "regulate" her If she continue to refuse, I may need to discharge her from my care  Orders: Protime ([id number]) Fingerstick (45409)  discussed with pt -- she will be willing to go back to [place] coumadin clinic. Margaret [name]  January 03, 2010 11:53 AM  Complete Medication List: 1)  Coumadin 5 Mg Tabs (Warfarin sodium) .... As directed 2)  Childrens Multivitamins Chew (Pediatric multiple vitamins) 3)  Baby Aspirin 81 Mg Chew (Aspirin) 4)  Azathioprine Tabs (Azathioprine tabs) .Marland Kitchen.. 125 mg 1 by mouth once daily 5)  Iron 25 Mg Tabs (Iron) 6)  Prilosec 20 Mg Cpdr (Omeprazole) .Marland Kitchen.. 1 by mouth once daily 7)  Flonase 50 Mcg/act Susp (Fluticasone propionate) .... 2 puffs once daily on each side of the nose 8)  Oxycodone Hcl 5 Mg Caps (Oxycodone hcl) .Marland Kitchen.. 1 by mouth two times a day  Other Orders: [facility] Referral (Coumadin clinic)  Laboratory Results   Blood Tests     PT: 24.7 s   (Normal Range: 10.6-13.4)  INR: 4.2   (Normal Range: 0.88-1.12   Therap INR: 2.0-3.5) Comments:  - RECHECKED 4.1, 24.5  - CURRENT DOSE - 5MG  tablets - 2 tablets daily except 2 1/2 tablets on tues & fri (65mg /wk)  - NEW DOSE - 5mg  tablets - 2 tablets daily except 1 1/2 on M, W, F (62.5mg /wk) Dr. Drue Novel would like for pt to go to coumadin clinic in 2 weeks.  Discussed with pt.  She states that she will NOT go to coumadin clinic.  She "DOES NOT LIKE THEM".  It is to far for her & they charge her a specialist co-pay & she "can't afford" that. Margaret [name]  January 02, 2010 11:24 AM see a/p Nolon Rod. Janequa Kipnis MD  January 02, 2010 5:58 PM     Orders Added: 1)  Protime [[id number]] 2)   Fingerstick [[id number]] 3)  [facility] Referral [Coumadin clinic] Prescriptions: OXYCODONE HCL 5 MG CAPS (OXYCODONE HCL) 1 by mouth two times a day  #60 x 0   Entered by:   Margaret [name]   Authorized by:   Nolon Rod. Agata Lucente MD   Signed by:   Margaret [name] on 01/02/2010   Method used:   Print then Give to Patient   RxID:   [id number]

## 2011-02-01 NOTE — Medication Information (Signed)
Summary: rov/sl  Anticoagulant Therapy  Managed by: Bethena Midget, RN, BSN PCP: Drue Novel MD, Leane Platt MD: Tenny Craw MD, Gunnar Fusi Indication 1: Deep venous thrombosis Lab Used: [facility] [place] Site: [address] INR POC 2.7 INR RANGE 2.0-3.0  Dietary changes: no    Health status changes: no    Bleeding/hemorrhagic complications: no    Recent/future hospitalizations: no    Any changes in medication regimen? yes       Details: OTC cold meds  Recent/future dental: no  Any missed doses?: no       Is patient compliant with meds? yes       Allergies: No Known Drug Allergies  Anticoagulation Management History:      The patient is taking warfarin and comes in today for a routine follow up visit.  Positive risk factors for bleeding include presence of serious comorbidities.  Negative risk factors for bleeding include an age less than 34 years old.  The bleeding index is 'intermediate risk'.  Negative CHADS2 values include Age > 84 years old.  Her last INR was 1.4.  Anticoagulation responsible provider: Tenny Craw MD, Gunnar Fusi.  INR POC: 2.7.  Cuvette Lot#: [id number].  Exp: 10/2011.    Anticoagulation Management Assessment/Plan:      The patient's current anticoagulation dose is Coumadin 5 mg tabs: as directed.  The target INR is 2.0-3.0.  The next INR is due 01/05/2011.  Anticoagulation instructions were given to patient.  Results were reviewed/authorized by Bethena Midget, RN, BSN.  She was notified by Bethena Midget, RN, BSN.         Prior Anticoagulation Instructions: INR 3.8  Today, Monday, November 21st, take Coumadin 0.5 tab (2.5 mg). Then, resume taking Coumadin 1.5 tabs on all days except for Coumadin 1 tab (5 mg) on Wednesdays. Return to clinic in 3 weeks.   Current Anticoagulation Instructions: INR 2.7 Continue 7.5mg s daily except 5mg s on Wednesdays. REcheck in 4 weeks.

## 2011-02-01 NOTE — Medication Information (Signed)
Summary: rov/cs   Anticoagulant Therapy  Managed by: Reina Fuse, PharmD PCP: Drue Novel MD, Leane Platt MD: Excell Seltzer MD, Casimiro Needle Indication 1: Deep venous thrombosis Lab Used: [facility] [place] Site: [address] INR POC 3.8 INR RANGE 2.0-3.0  Dietary changes: no    Health status changes: no    Bleeding/hemorrhagic complications: no    Recent/future hospitalizations: no    Any changes in medication regimen? no    Recent/future dental: no  Any missed doses?: no       Is patient compliant with meds? yes       Allergies: No Known Drug Allergies  Anticoagulation Management History:      The patient is taking warfarin and comes in today for a routine follow up visit.  Positive risk factors for bleeding include presence of serious comorbidities.  Negative risk factors for bleeding include an age less than 61 years old.  The bleeding index is 'intermediate risk'.  Negative CHADS2 values include Age > 38 years old.  Her last INR was 1.4.  Anticoagulation responsible Margaret [name]: Excell Seltzer MD, Casimiro Needle.  INR POC: 3.8.  Cuvette Lot#: [id number].  Exp: 11/2011.    Anticoagulation Management Assessment/Plan:      The patient's current anticoagulation dose is Coumadin 5 mg tabs: as directed.  The target INR is 2.0-3.0.  The next INR is due 12/08/2010.  Anticoagulation instructions were given to patient.  Results were reviewed/authorized by Reina Fuse, PharmD.  She was notified by Reina Fuse PharmD.         Prior Anticoagulation Instructions: INR 2.8  Continue Coumadin as scheduled:  1.5 tablets every day of the week, except 1 tableton Wednesday.  Return to clinic in 3 weeks.   Current Anticoagulation Instructions: INR 3.8  Today, Monday, November 21st, take Coumadin 0.5 tab (2.5 mg). Then, resume taking Coumadin 1.5 tabs on all days except for Coumadin 1 tab (5 mg) on Wednesdays. Return to clinic in 3 weeks.

## 2011-02-01 NOTE — Medication Information (Signed)
Summary: rov/eac  Anticoagulant Therapy  Managed by: Weston Brass, PharmD PCP: Drue Novel MD, Leane Platt MD: Tenny Craw MD, Gunnar Fusi Indication 1: Deep venous thrombosis Lab Used: [facility] [place] Site: [address] INR POC 2.2 INR RANGE 2.0-3.0  Dietary changes: no    Health status changes: yes       Details: was dizzy for several days over the past week; no issues now.  Also had numbness in R arm for  ~20 mins on day- only occured once  Bleeding/hemorrhagic complications: no    Recent/future hospitalizations: no    Any changes in medication regimen? no    Recent/future dental: no  Any missed doses?: no       Is patient compliant with meds? yes       Allergies: No Known Drug Allergies  Anticoagulation Management History:      The patient is taking warfarin and comes in today for a routine follow up visit.  Negative risk factors for bleeding include an age less than 43 years old.  The bleeding index is 'low risk'.  Negative CHADS2 values include Age > 50 years old.  Her last INR was 4.2.  Anticoagulation responsible provider: Tenny Craw MD, Gunnar Fusi.  INR POC: 2.2.  Cuvette Lot#: [id number].  Exp: 05/2011.    Anticoagulation Management Assessment/Plan:      The patient's current anticoagulation dose is Coumadin 5 mg tabs: as directed.  The target INR is 2.0-3.0.  The next INR is due 04/28/2010.  Anticoagulation instructions were given to patient.  Results were reviewed/authorized by Weston Brass, PharmD.  She was notified by Weston Brass PharmD.         Prior Anticoagulation Instructions: INR 2.1  Continue current dosing schedule of 2 tablets daily, except 1.5 tablets on Wednesday.  Return to clinic in 3 weeks.   Current Anticoagulation Instructions: INR 2.2  Continue same dose of 2 tablets every day except 1 1/2 tablets on Wednesday

## 2011-02-01 NOTE — Medication Information (Signed)
Summary: rov/cb  Anticoagulant Therapy  Managed by: Bethena Midget, RN, BSN PCP: Drue Novel MD, Leane Platt MD: Tenny Craw MD, Gunnar Fusi Indication 1: Deep venous thrombosis Lab Used: [facility] [place] Site: [address] INR POC 1.8 INR RANGE 2.0-3.0  Dietary changes: no    Health status changes: no    Bleeding/hemorrhagic complications: no    Recent/future hospitalizations: no    Any changes in medication regimen? no    Recent/future dental: no  Any missed doses?: no       Is patient compliant with meds? yes       Allergies: No Known Drug Allergies  Anticoagulation Management History:      The patient is taking warfarin and comes in today for a routine follow up visit.  Negative risk factors for bleeding include an age less than 31 years old.  The bleeding index is 'low risk'.  Negative CHADS2 values include Age > 48 years old.  Her last INR was 4.2.  Anticoagulation responsible provider: Tenny Craw MD, Gunnar Fusi.  INR POC: 1.8.  Cuvette Lot#: [id number].  Exp: 04/2011.    Anticoagulation Management Assessment/Plan:      The patient's current anticoagulation dose is Coumadin 5 mg tabs: as directed.  The target INR is 2.0-3.0.  The next INR is due 02/24/2010.  Anticoagulation instructions were given to patient.  Results were reviewed/authorized by Bethena Midget, RN, BSN.  She was notified by Bethena Midget, RN, BSN.         Prior Anticoagulation Instructions: INR 3.6. Hold today, then continue 2 tablets daily except 1.5 on Wed.   Current Anticoagulation Instructions: INR 1.8 Today take 12.5mg  Then resume 10mg s everyday except 7.5mg s on Wednesdays. Recheck in 2 weeks.

## 2011-02-01 NOTE — Assessment & Plan Note (Signed)
Summary: CBC and pt/inr      Allergies Added: NKDA Nurse Visit   Vital Signs:  Patient profile:   36 year old female Height:      59 inches Weight:      272 pounds Temp:     98.6 degrees F oral Pulse rate:   76 / minute BP sitting:   118 / 90  (left arm)  Vitals Entered By: Jeremy Johann CMA (June 09, 2010 10:14 AM)  History of Present Illness: here for a followup She started with right upper abdominal pain 2 and half weeks ago, pain is daily, steady, she is tender to touch. No change with food intake, no fever. Some nausea.  she was also found to have a very high INR, her Coumadin was held for a few days, she now restarted 5 mg of Coumadin daily for this ago.  She saw a GI doctor 06-06-10, they felt that she needed Benefiber, constipation?Marland Kitchen They are not planning further workup  She also saw her gynecologist due to heavy bleeding They are planning a sono histogram The like to daycare of day he be bleeding permanently, they say they need to coordinate any procedure  ( uterine ablation?) with hematology due to her tendency to clot.  The patient continued to have pain in the upper right abdomen and right flank. She denies actual back pain   Impression & Recommendations:  Problem # 1:  ABDOMINAL PAIN (ICD-789.00) I am concerned about the combination of elevated INR, acute anemia, right upper quadrant and right flank pain. Intra-abdominal bleed? Gallbladder? plan:  CT of the abdomen without contrast  to rule out bleeding----- patient's cell [phone] Ultrasound of the gallbladder ---------------------------CT no bleed, will proceed w/ u/s  Orders: TLB-Hepatic/Liver Function Pnl (80076-HEPATIC) Radiology Referral (Radiology) Radiology Referral (Radiology)  Problem # 2:  MENSTRUAL DISORDER (ICD-626.9) per gynecology, see history of present illness  Problem # 3:  ENCOUNTER FOR THERAPEUTIC DRUG MONITORING (ICD-V58.83) Will talk with the Coumadin clinic for further  advise-- done, spoke w/  Kennon Rounds  Orders: Protime ([id number])  Complete Medication List: 1)  Coumadin 5 Mg Tabs (Warfarin sodium) .... As directed 2)  Childrens Multivitamins Chew (Pediatric multiple vitamins) 3)  Baby Aspirin 81 Mg Chew (Aspirin) 4)  Azathioprine 50 Mg Tabs (Azathioprine) .... Take 2 1/2 tablets daily 5)  Iron 25 Mg Tabs (Iron) 6)  Prilosec 20 Mg Cpdr (Omeprazole) .... As needed 7)  Oxycodone Hcl 5 Mg Caps (Oxycodone hcl) .... As needed 8)  Astepro 0.15 % Soln (Azelastine hcl) .... 2 sprays on each side of the nose two times a day as needed 9)  Tramadol Hcl 50 Mg Tabs (Tramadol hcl) .Marland Kitchen.. 1 every 6 hrs as needed pain  Other Orders: Venipuncture (19147) TLB-CBC Platelet - w/Differential (85025-CBCD)   Review of Systems      See HPI   Physical Exam  General:  alert, well-developed, and overweight-appearing.   Abdomen:  soft.  tender at the right upper quadrant and slightly at the right flank   CC: pt check    Allergies (verified): No Known Drug Allergies Laboratory Results   Blood Tests    Date/Time Reported: June 09, 2010 10:15 AM   INR: 1.4   (Normal Range: 0.88-1.12   Therap INR: 2.0-3.5) Comments: current dose 5mg  , 1 tab once daily ....Marland KitchenMarland KitchenFelecia Deloach CMA  June 09, 2010 10:15 AM     Orders Added: 1)  Venipuncture [82956] 2)  TLB-Hepatic/Liver Function Pnl [80076-HEPATIC] 3)  TLB-CBC Platelet -  w/Differential [85025-CBCD] 4)  Protime [[id number]] 5)  Radiology Referral [Radiology] 6)  Radiology Referral [Radiology] 7)  Est. Patient Level III [59563]

## 2011-02-01 NOTE — Medication Information (Signed)
Summary: Coumadin Clinic  Anticoagulant Therapy  Managed by: Weston Brass, PharmD PCP: Drue Novel MD, Leane Platt MD: Clifton [name] MD, Cristal Deer Indication 1: Deep venous thrombosis Lab Used: [facility] [place] Site: [address] INR POC 1.4 INR RANGE 2.0-3.0  Dietary changes: no    Health status changes: no    Bleeding/hemorrhagic complications: yes       Details: pt was having blood in stools; reports this is resolving.  Tests have been negative so far for abdominal bleeding  Recent/future hospitalizations: no    Any changes in medication regimen? no    Recent/future dental: no  Any missed doses?: yes     Details: Pt has been holding Coumadin after supratherapeutic INR, blood in stools and abdominal pain.  Restarted Coumadin 5mg  on Tuesday   Is patient compliant with meds? yes      Comments: Lab checked by Dr. Drue Novel today.   Allergies: No Known Drug Allergies  Anticoagulation Management History:      Her anticoagulation is being managed by telephone today.  Positive risk factors for bleeding include presence of serious comorbidities.  Negative risk factors for bleeding include an age less than 48 years old.  The bleeding index is 'intermediate risk'.  Negative CHADS2 values include Age > 52 years old.  Her last INR was 1.6 ratio.  Anticoagulation responsible provider: Clifton [name] MD, Cristal Deer.  INR POC: 1.4.  Exp: 08/2011.    Anticoagulation Management Assessment/Plan:      The patient's current anticoagulation dose is Coumadin 5 mg tabs: as directed.  The target INR is 2.0-3.0.  The next INR is due 06/16/2010.  Anticoagulation instructions were given to patient.  Results were reviewed/authorized by Weston Brass, PharmD.  She was notified by Weston Brass PharmD.         Prior Anticoagulation Instructions: INR 6.6. Go to lab to have PT/INR drawn.  Will call this afternoon with further instructions.  Call at [phone] (cell) or [phone] (work).  Appt Monday for  recheck.  Hold Coumadin until then.  12:40 [place]oland  Current Anticoagulation Instructions: INR 1.4  Start new dose of 1 1/2 tablets daily. Repeat INR in 1 week.

## 2011-02-01 NOTE — Progress Notes (Signed)
Summary: gyn asap  ---- Converted from flag ---- ---- 06/05/2010 8:38 AM, Jose E. Paz MD wrote: Margaret [name], please schedule an appointment w/ gyn ASAP  ---- 06/04/2010 6:18 PM, [name] MD wrote: Margaret [name], she will have labs 06/06 here;please ask your Nurse to schedule Gyn F/U ASAP (see OV). Hopp ------------------------------  Spoke with pt she currently see dr Margaret [name]. Informed pt will call to schedule appt for her. per dr Margaret [name] office pt has a unpaid bill with them so they will not see pt until she pays it off. Advised pt of issue with scheduling appt. pt states that she will call them and straighting issue out and set up her appt with them asap.Felecia Deloach CMA  June 05, 2010 2:39 PM

## 2011-02-01 NOTE — Medication Information (Signed)
Summary: rov/sp  Medications Added COUMADIN 5 MG TABS (WARFARIN SODIUM) as directed       Anticoagulant Therapy  Managed by: Weston Brass, PharmD PCP: Drue Novel MD, Leane Platt MD: Jens Som MD, Arlys John Indication 1: Deep venous thrombosis Lab Used: [facility] [place] Site: [address] INR POC 2.4 INR RANGE 2.0-3.0  Dietary changes: no    Health status changes: no    Bleeding/hemorrhagic complications: no    Recent/future hospitalizations: no    Any changes in medication regimen? no    Recent/future dental: no  Any missed doses?: no       Is patient compliant with meds? yes       Allergies: No Known Drug Allergies  Anticoagulation Management History:      The patient is taking warfarin and comes in today for a routine follow up visit.  Positive risk factors for bleeding include presence of serious comorbidities.  Negative risk factors for bleeding include an age less than 79 years old.  The bleeding index is 'intermediate risk'.  Negative CHADS2 values include Age > 56 years old.  Her last INR was 1.4.  Anticoagulation responsible provider: Jens Som MD, Arlys John.  INR POC: 2.4.  Exp: 10/2011.    Anticoagulation Management Assessment/Plan:      The patient's current anticoagulation dose is Coumadin 5 mg tabs: as directed.  The target INR is 2.0-3.0.  The next INR is due 02/02/2011.  Anticoagulation instructions were given to patient.  Results were reviewed/authorized by Weston Brass, PharmD.         Prior Anticoagulation Instructions: INR 2.7 Continue 7.5mg s daily except 5mg s on Wednesdays. REcheck in 4 weeks.   Current Anticoagulation Instructions: INR 2.4 (INR goal:  2-3)  Continue current Coumadin regimen of 1 and 1/2 tablets on Sundays, Mondays, Tuesdays, Thursdays, Fridays, and Saturdays and 1 tablet on Wednesdays.   Prescriptions: COUMADIN 5 MG TABS (WARFARIN SODIUM) as directed  #30 x 3   Entered by:   Amanda Misiewicz PharmD   Authorized by:    Brian Saunders Crenshaw, MD, FACC   Signed by:   Amanda Misiewicz PharmD on 01/05/2011   Method used:   Electronically to        [facility] [address]* (retail)       [address]       [place], [place]  [address]       Ph: [phone] or [phone]       Fax: [phone]   RxID:   [id number]

## 2011-02-01 NOTE — Medication Information (Signed)
Summary: rov/sp  Anticoagulant Therapy  Managed by: Bethena Midget, RN, BSN PCP: Drue Novel MD, Leane Platt MD: Juanda Chance MD, Jazlynn Nemetz Indication 1: Deep venous thrombosis Lab Used: [facility] [place] Site: [address] INR POC 4.4 INR RANGE 2.0-3.0  Dietary changes: no    Health status changes: yes       Details: Pt. she has had a GI upset for past few days.   Bleeding/hemorrhagic complications: no    Recent/future hospitalizations: no    Any changes in medication regimen? no    Recent/future dental: no  Any missed doses?: no       Is patient compliant with meds? yes       Allergies: No Known Drug Allergies  Anticoagulation Management History:      The patient is taking warfarin and comes in today for a routine follow up visit.  Negative risk factors for bleeding include an age less than 28 years old.  The bleeding index is 'low risk'.  Negative CHADS2 values include Age > 92 years old.  Her last INR was 4.2.  Anticoagulation responsible provider: Juanda Chance MD, Smitty Cords.  INR POC: 4.4.  Cuvette Lot#: [id number].  Exp: 06/2011.    Anticoagulation Management Assessment/Plan:      The patient's current anticoagulation dose is Coumadin 5 mg tabs: as directed.  The target INR is 2.0-3.0.  The next INR is due 05/12/2010.  Anticoagulation instructions were given to patient.  Results were reviewed/authorized by Bethena Midget, RN, BSN.  She was notified by Bethena Midget, RN, BSN.         Prior Anticoagulation Instructions: INR 2.2  Continue same dose of 2 tablets every day except 1 1/2 tablets on Wednesday   Current Anticoagulation Instructions: INR 4.4 Skip today. Then resume 10mg  daily except 7.5mg s on Wednesdays. Recheck in 2 weeks.

## 2011-02-01 NOTE — Assessment & Plan Note (Signed)
Summary: [facility] PT--COUMIDIN # HIGH 7 OR 9; HEMOGLOBIN=9.SOMETHING--SAID .Marland KitchenMarland Kitchen   Vital Signs:  Patient profile:   36 year old female Weight:      276.2 pounds BMI:     55.99 O2 Sat:      97 % Temp:     98.6 degrees F oral Pulse rate:   109 / minute Resp:     16 per minute BP sitting:   124 / 86  (left arm) Cuff size:   large  Vitals Entered By: Shonna Chock (June 02, 2010 4:04 PM) CC: Patient here per Coumadin clinic-abnormal labs, patient is holding coumadin until Monday and then to follow-up with coumadin clinic Monday. Patient states" I feel tired, feels like Im here but not here.", Abdominal pain Comments REVIEWED MED LIST, PATIENT AGREED DOSE AND INSTRUCTION CORRECT    Primary Care Provider:  Drue Novel MD, [facility]  CC:  Patient here per Coumadin clinic-abnormal labs, patient is holding coumadin until Monday and then to follow-up with coumadin clinic Monday. Patient states" I feel tired, feels like Im here but not here.", and Abdominal pain.  History of Present Illness: PT/INR was 7.6 this am @ Coumadin Clinic ; H/H drawn to evaluate elicited history of  fatigue &  dark stools for several weeks. On coumadin for DVT ? X4(PMR lists 2) in LE in context of anticardiolipin antibodies.No FH of coagulopathy but patient is adopted. No  PMH of  GI disease.Also she has had some R flank area pain X 1&1/2 weeks; no evaluation to date of this." I told my husband about it".The patient reports nausea and anorexia, but denies vomiting, hematochezia, and hematemesis.  The location of the pain is right upper quadrant & has no triggers or relievers.  The pain is described as intermittent and burning in quality.  Associated symptoms include vaginal bleeding; menses lasted 3 weeks in April & 2 weeks in May.  The patient denies the following symptoms: fever, weight loss, dysuria, chest pain, jaundice, dark urine, and missed menstrual period although menses have been irregular.    Allergies (verified): No Known Drug  Allergies  Past History:  Past Medical History: DVT, hx of (1996) DVT, hx of (2000) GERD OBESITY, MORBID  ANTICARDIOLIPIN ANTIBODY SYNDROME  HEPATITIS AUTOINMUNE  Abnormal menses  Past Surgical History: D&C approx 11/2008  Review of Systems General:  Complains of chills and sweats; denies fever. Eyes:  Complains of blurring; denies double vision and vision loss-both eyes; No "blacking out of vision". ENT:  Complains of nosebleeds; denies difficulty swallowing and hoarseness. CV:  Denies chest pain or discomfort and shortness of breath with exertion. Resp:  Denies coughing up blood. GI:  Complains of indigestion; denies bloody stools. GU:  Denies hematuria. Derm:  Denies lesion(s) and rash. Heme:  Complains of abnormal bruising; denies bleeding; Menses are irregular & heavy; Dr Randa Evens has Rxed Prometrium.  Physical Exam  General:  in no acute distress; alert,appropriate and cooperative throughout examination Eyes:  No corneal or conjunctival inflammation noted. EOMI. Perrla. No jaundice Lungs:  Normal respiratory effort, chest expands symmetrically. Lungs are clear to auscultation, no crackles or wheezes. Heart:  normal rate, regular rhythm, no gallop, no rub, no JVD, no HJR, and grade 1 /6 systolic murmur.   Abdomen:  Bowel sounds positive,abdomen soft  but slightly tender  RUQ laterally without masses, organomegaly or hernias noted. Pulses:  R and L carotid,radial,dorsalis pedis and posterior tibial pulses are full and equal bilaterally Extremities:  No clubbing, cyanosis, edema. Skin:  Intact without suspicious lesions or rashes Cervical Nodes:  No lymphadenopathy noted Axillary Nodes:  No palpable lymphadenopathy Psych:  memory intact for recent and remote, normally interactive, and good eye contact.     Impression & Recommendations:  Problem # 1:  ANEMIA (ICD-285.9)  probably from abnormal menses Her updated medication list for this problem includes:    Iron 25 Mg  Tabs (Iron)  Orders: UA Dipstick w/o Micro (manual) (08657)  Problem # 2:  ABDOMINAL PAIN (ICD-789.00) R flank/ RUQ. UA normal  Problem # 3:  MENSTRUAL DISORDER (ICD-626.9) irregular,prolonged (lasting 2-3  weeks ) menses  Problem # 4:  ANTICARDIOLIPIN ANTIBODY SYNDROME (ICD-795.79)  Problem # 5:  DVT (ICD-453.40) PMH of  Complete Medication List: 1)  Coumadin 5 Mg Tabs (Warfarin sodium) .... As directed 2)  Childrens Multivitamins Chew (Pediatric multiple vitamins) 3)  Baby Aspirin 81 Mg Chew (Aspirin) 4)  Azathioprine 50 Mg Tabs (Azathioprine) .... Take 2 1/2 tablets daily 5)  Iron 25 Mg Tabs (Iron) 6)  Prilosec 20 Mg Cpdr (Omeprazole) .... As needed 7)  Oxycodone Hcl 5 Mg Caps (Oxycodone hcl) .... As needed 8)  Astepro 0.15 % Soln (Azelastine hcl) .... 2 sprays on each side of the nose two times a day as needed 9)  Tramadol Hcl 50 Mg Tabs (Tramadol hcl) .Marland Kitchen.. 1 every 6 hrs as needed pain  Patient Instructions: 1)  To ER if any acute bleeding occurs. The  etiology of your abnormal menses MUST be defined because of the need for coumadin. Please see your Gynecologist ASAP.Complete stool cards. Labs here Monday am : 2)  CBC w/ Diff ;PT/INR ; iron panel; B12/folate; hepatic panel; amylase; lipase; BMET.  Prescriptions: TRAMADOL HCL 50 MG TABS (TRAMADOL HCL) 1 every 6 hrs as needed pain  #30 x 0   Entered and Authorized by:   [name] MD   Signed by:   [name] MD on 06/02/2010   Method used:   Faxed to ...       [facility] [address]* (retail)       [address]       [place], [place]  [address]       Ph: [phone] or [phone]       Fax: [phone]   RxID:   [id number]   Laboratory Results   Urine Tests    Routine Urinalysis   Color: straw Appearance: Cloudy Glucose: negative   (Normal Range: Negative) Bilirubin: negative   (Normal Range: Negative) Ketone: negative   (Normal Range: Negative) Spec. Gravity: 1.025    (Normal Range: 1.003-1.035) Blood: negative   (Normal Range: Negative) pH: 6.0   (Normal Range: 5.0-8.0) Protein: negative   (Normal Range: Negative) Urobilinogen: 0.2   (Normal Range: 0-1) Nitrite: negative   (Normal Range: Negative) Leukocyte Esterace: negative   (Normal Range: Negative)

## 2011-02-01 NOTE — Medication Information (Signed)
Summary: DVT/antiphos antibiody/ewj  Anticoagulant Therapy  Managed by: Bethena Midget, RN, BSN PCP: Drue Novel MD, Leane Platt MD: Daleen Squibb MD, Maisie Fus Indication 1: Deep venous thrombosis Lab Used: [facility] [place] Site: [address] INR POC 1.6  Dietary changes: no    Health status changes: no    Bleeding/hemorrhagic complications: yes       Details: occ. nosebled.   Recent/future hospitalizations: no    Any changes in medication regimen? no    Recent/future dental: no  Any missed doses?: no       Is patient compliant with meds? yes      Comments: Pt. states she has been on coumadin since age 36yrs. Previous followed by Dr. Drue Novel. He was having trouble regulating thus now followed by [facility]. Present dose is 10mg s daily except 7.5mg s on MWF.   Current Medications (verified): 1)  Coumadin 5 Mg Tabs (Warfarin Sodium) .... As Directed 2)  Childrens Multivitamins  Chew (Pediatric Multiple Vitamins) 3)  Baby Aspirin 81 Mg Chew (Aspirin) 4)  Azathioprine 50 Mg Tabs (Azathioprine) .... Take 2 1/2 Tablets Daily 5)  Iron 25 Mg Tabs (Iron) 6)  Prilosec 20 Mg Cpdr (Omeprazole) .... As Needed 7)  Oxycodone Hcl 5 Mg Caps (Oxycodone Hcl) .... As Needed 8)  Cefprozil 500 Mg Tabs (Cefprozil) .... Two By Mouth Twice A Day For One Week 9)  Astepro 0.15 % Soln (Azelastine Hcl) .... 2 Sprays On Each Side of The Nose Two Times A Day 10)  Guaifenesin Ac 100-10 Mg/74ml Syrp (Guaifenesin-Codeine) .... One or 2 Teaspoons At Bedtime As Needed For  Severe Cough  Allergies (verified): No Known Drug Allergies  Anticoagulation Management History:      The patient comes in today for her initial visit for anticoagulation therapy.  Negative risk factors for bleeding include an age less than 35 years old.  The bleeding index is 'low risk'.  Negative CHADS2 values include Age > 35 years old.  Her last INR was 4.2.  Anticoagulation responsible provider: Daleen Squibb MD, Maisie Fus.  INR POC: 1.6.  Cuvette Lot#:  [id number].  Exp: 04/2011.    Anticoagulation Management Assessment/Plan:      The patient's current anticoagulation dose is Coumadin 5 mg tabs: as directed.  The next INR is due 01/27/2010.  Anticoagulation instructions were given to patient.  Results were reviewed/authorized by Bethena Midget, RN, BSN.  She was notified by Bethena Midget, RN, BSN.         Current Anticoagulation Instructions: INR 1.6 Continue dose to 10mg s everyday except 7.5mg s on Mondays and Wednesdays. recheck in one week.

## 2011-02-01 NOTE — Medication Information (Signed)
Summary: rov/tm  Anticoagulant Therapy  Managed by: Eda Keys, PharmD PCP: Drue Novel MD, Leane Platt MD: Jens Som MD, Arlys John Indication 1: Deep venous thrombosis Lab Used: [facility] [place] Site: [address] INR POC 2.1 INR RANGE 2.0-3.0  Dietary changes: no    Health status changes: no    Bleeding/hemorrhagic complications: yes       Details: 4 nosebleeds over the past 2 weeks as well as menstrual spotting  Recent/future hospitalizations: no    Any changes in medication regimen? no    Recent/future dental: no  Any missed doses?: no       Is patient compliant with meds? yes       Allergies: No Known Drug Allergies  Anticoagulation Management History:      The patient is taking warfarin and comes in today for a routine follow up visit.  Negative risk factors for bleeding include an age less than 68 years old.  The bleeding index is 'low risk'.  Negative CHADS2 values include Age > 29 years old.  Her last INR was 4.2.  Anticoagulation responsible provider: Jens Som MD, Arlys John.  INR POC: 2.1.  Cuvette Lot#: [id number].  Exp: 04/2011.    Anticoagulation Management Assessment/Plan:      The patient's current anticoagulation dose is Coumadin 5 mg tabs: as directed.  The target INR is 2.0-3.0.  The next INR is due 03/17/2010.  Anticoagulation instructions were given to patient.  Results were reviewed/authorized by Eda Keys, PharmD.  She was notified by Eda Keys.         Prior Anticoagulation Instructions: INR 1.8 Today take 12.5mg  Then resume 10mg s everyday except 7.5mg s on Wednesdays. Recheck in 2 weeks.   Current Anticoagulation Instructions: INR 2.1  Continue current dosing schedule of 2 tablets daily, except 1.5 tablets on Wednesday.  Return to clinic in 3 weeks.

## 2011-02-01 NOTE — Progress Notes (Signed)
Summary: Refill--Oxy rx  Phone Note Refill Request Call back at Home Phone [phone] Message from:  Patient on November 20, 2010 3:47 PM  Refills Requested: Medication #1:  OXYCODONE HCL 5 MG CAPS as needed   Last Refilled: 10/24/2010 Initial call taken by: Lucious Groves CMA,  November 20, 2010 3:47 PM  Follow-up for Phone Call        60, no RF Follow-up by: Nolon Rod. Paz MD,  November 20, 2010 5:05 PM  Additional Follow-up for Phone Call Additional follow up Details #1::        Patient notified prescription is up front and ready for pickup. Lucious Groves CMA  November 21, 2010 9:52 AM     Prescriptions: OXYCODONE HCL 5 MG CAPS (OXYCODONE HCL) as needed  #30 x 0   Entered by:   Lucious Groves CMA   Authorized by:   Nolon Rod. Paz MD   Signed by:   Lucious Groves CMA on 11/21/2010   Method used:   Printed then faxed to ...       [facility] [phone]* (retail)       [address]       [place], [place]  [address]       Ph: [phone]       Fax: [phone]   RxID:   [id number]

## 2011-02-01 NOTE — Consult Note (Signed)
Summary: [facility]   Imported By: Lanelle Bal 06/15/2010 08:58:59  _____________________________________________________________________  External Attachment:    Type:   Image     Comment:   External Document

## 2011-02-01 NOTE — Medication Information (Signed)
Summary: rov/ewj  Anticoagulant Therapy  Managed by: Cloyde Reams, RN, BSN PCP: Drue Novel MD, Leane Platt MD: Myrtis Ser MD, Tinnie Gens Indication 1: Deep venous thrombosis Lab Used: [facility] [place] Site: [address] INR POC 1.5 INR RANGE 2.0-3.0  Dietary changes: yes       Details: Decr appetite.   Health status changes: no    Bleeding/hemorrhagic complications: no    Recent/future hospitalizations: no    Any changes in medication regimen? no    Recent/future dental: no  Any missed doses?: yes     Details: Missed last night's dosage of Coumadin, needs refill on rx.    Is patient compliant with meds? yes       Allergies: No Known Drug Allergies  Anticoagulation Management History:      The patient is taking warfarin and comes in today for a routine follow up visit.  Positive risk factors for bleeding include presence of serious comorbidities.  Negative risk factors for bleeding include an age less than 52 years old.  The bleeding index is 'intermediate risk'.  Negative CHADS2 values include Age > 60 years old.  Her last INR was 1.4.  Anticoagulation responsible Dhaval Woo: Myrtis Ser MD, Tinnie Gens.  INR POC: 1.5.  Cuvette Lot#: [id number].  Exp: 11/2011.    Anticoagulation Management Assessment/Plan:      The patient's current anticoagulation dose is Coumadin 5 mg tabs: as directed.  The target INR is 2.0-3.0.  The next INR is due 10/27/2010.  Anticoagulation instructions were given to patient.  Results were reviewed/authorized by Cloyde Reams, RN, BSN.  She was notified by Cloyde Reams RN.         Prior Anticoagulation Instructions: INR 2.7  Continue on same dosage 7.5mg  daily except 5mg  on Wednesdays.  Recheck in 4 weeks.    Current Anticoagulation Instructions: INR 1.5  Take 10mg  today and tomorrow, then resume same dosage 1.5 tablets daily except 1 tablet on Wednesdays.  Recheck in 2 weeks.   Prescriptions: COUMADIN 5 MG TABS (WARFARIN SODIUM) as directed Brand  medically necessary #60 x 2   Entered by:   Cloyde Reams RN   Authorized by:   Talitha Givens, MD, [facility]   Signed by:   Cloyde Reams RN on 10/13/2010   Method used:   Electronically to        [facility] [address] (retail)       [address]       [place], [place]  [address]       Ph: [phone] or [phone]       Fax: [phone]   RxID:   [id number]

## 2011-02-01 NOTE — Assessment & Plan Note (Signed)
Summary: acute only, sinus infection/swh   Vital Signs:  Patient profile:   36 year old female Height:      59 inches Weight:      276 pounds Temp:     98 degrees F BP sitting:   128 / 80  Vitals Entered By: Shary Decamp (January 16, 2010 4:34 PM) CC: acute only Comments  - cold sxs x several weeks  - eyes matted in am  - green nasal d/c  - cough Shary Decamp  January 16, 2010 4:38 PM    History of Present Illness: sick for 3 weeks, see above   Current Medications (verified): 1)  Coumadin 5 Mg Tabs (Warfarin Sodium) .... As Directed 2)  Childrens Multivitamins  Chew (Pediatric Multiple Vitamins) 3)  Baby Aspirin 81 Mg Chew (Aspirin) 4)  Azathioprine  Tabs (Azathioprine Tabs) .Marland Kitchen.. 125 Mg 1 By Mouth Once Daily 5)  Iron 25 Mg Tabs (Iron) 6)  Prilosec 20 Mg Cpdr (Omeprazole) .Marland Kitchen.. 1 By Mouth Once Daily 7)  Flonase 50 Mcg/act  Susp (Fluticasone Propionate) .... 2 Puffs Once Daily On Each Side of The Nose 8)  Oxycodone Hcl 5 Mg Caps (Oxycodone Hcl) .Marland Kitchen.. 1 By Mouth Two Times A Day  Allergies (verified): No Known Drug Allergies  Past History:  Past Medical History: Reviewed history from 06/03/2009 and no changes required. DVT, hx of (1996) DVT, hx of (2000) GERD OBESITY, MORBID  ANTICARDIOLIPIN ANTIBODY SYNDROME  HEPATITIS AUTOINMUNE   Past Surgical History: Reviewed history from 07/29/2009 and no changes required. D&C aprox 12-09  Social History: Married one child quit tobacco in December 2010  Review of Systems       had fever only initially has  chest congestion and sputum, no hemoptysis  some difficulty sleeping due to cough  Physical Exam  General:  alert and overweight-appearing.   Head:  face symmetric, not tender to palpation in the sinus area Ears:  R ear normal and L ear normal.   Mouth:  no redness or discharge Lungs:  normal respiratory effort, no intercostal retractions, and no accessory muscle use. few rhonchi that cleared  with cough , no  distress Heart:  normal rate, regular rhythm, no murmur, and no gallop.     Impression & Recommendations:  Problem # 1:  BRONCHITIS- ACUTE (ICD-466.0)  Her updated medication list for this problem includes:    Cefprozil 500 Mg Tabs (Cefprozil) ..... One by mouth twice a day for one week    Guaifenesin Ac 100-10 Mg/13ml Syrp (Guaifenesin-codeine) ..... One or 2 teaspoons at bedtime as needed for  severe cough  Complete Medication List: 1)  Coumadin 5 Mg Tabs (Warfarin sodium) .... As directed 2)  Childrens Multivitamins Chew (Pediatric multiple vitamins) 3)  Baby Aspirin 81 Mg Chew (Aspirin) 4)  Azathioprine Tabs (Azathioprine tabs) .Marland Kitchen.. 125 mg 1 by mouth once daily 5)  Iron 25 Mg Tabs (Iron) 6)  Prilosec 20 Mg Cpdr (Omeprazole) .Marland Kitchen.. 1 by mouth once daily 7)  Flonase 50 Mcg/act Susp (Fluticasone propionate) .... 2 puffs once daily on each side of the nose 8)  Oxycodone Hcl 5 Mg Caps (Oxycodone hcl) .Marland Kitchen.. 1 by mouth two times a day 9)  Cefprozil 500 Mg Tabs (Cefprozil) .... One by mouth twice a day for one week 10)  Astepro 0.15 % Soln (Azelastine hcl) .... 2 sprays on each side of the nose two times a day 11)  Guaifenesin Ac 100-10 Mg/109ml Syrp (Guaifenesin-codeine) .... One or 2 teaspoons at bedtime  as needed for  severe cough  Patient Instructions: 1)  rest 2)  lots of fluids 3)  Robitussin-DM over-the-counter for cough 4)  codeine at night the cough is severe 5)  astepro as prescribed for nasal congestion 6)  antibiotics as prescribed (cefprozil) 7)  call if not better in a few days Prescriptions: GUAIFENESIN AC 100-10 MG/5ML SYRP (GUAIFENESIN-CODEINE) one or 2 teaspoons at bedtime as needed for  severe cough  #150cc x 0   Entered and Authorized by:   Elita Quick E. Niveah Boerner MD   Signed by:   Nolon Rod. Enyla Lisbon MD on 01/16/2010   Method used:   Print then Give to Patient   RxID:   [id number] ASTEPRO 0.15 % SOLN (AZELASTINE HCL) 2 sprays on each side of the nose two times a day  #1 x 1    Entered and Authorized by:   Elita Quick E. Joliet Mallozzi MD   Signed by:   Nolon Rod. Jassmin Kemmerer MD on 01/16/2010   Method used:   Print then Give to Patient   RxID:   [id number] CEFPROZIL 500 MG TABS (CEFPROZIL) one by mouth twice a day for one week  #14 x 0   Entered and Authorized by:   Nolon Rod. Ules Marsala MD   Signed by:   Nolon Rod. Kaelene Elliston MD on 01/16/2010   Method used:   Print then Give to Patient   RxID:   [id number]

## 2011-02-01 NOTE — Progress Notes (Signed)
Summary: Oxycodone Refill  Phone Note Refill Request Call back at Home Phone [phone] Message from:  Patient on October 24, 2010 3:44 PM  Refills Requested: Medication #1:  OXYCODONE HCL 5 MG CAPS as needed   Last Refilled: 09/15/2010  Method Requested: Pick up at Office Initial call taken by: Army Fossa CMA,  October 24, 2010 3:45 PM  Follow-up for Phone Call        ok  #60, no refills Follow-up by: [facility] E. Paz MD,  October 24, 2010 4:07 PM    Prescriptions: OXYCODONE HCL 5 MG CAPS (OXYCODONE HCL) as needed  #60 x 0   Entered by:   Army Fossa CMA   Authorized by:   Nolon Rod. Paz MD   Signed by:   Army Fossa CMA on 10/24/2010   Method used:   Print then Give to Patient   RxID:   [id number]

## 2011-02-01 NOTE — Progress Notes (Signed)
Summary: oxy refill  Phone Note Refill Request Message from:  Patient  Refills Requested: Medication #1:  OXYCODONE HCL 5 MG CAPS as needed   Last Refilled: 12/08/2010 will call when ready for pick up.    Method Requested: Pick up at Office Initial call taken by: Army Fossa CMA,  January 04, 2011 4:27 PM  Follow-up for Phone Call        60, no RF Follow-up by: Nolon Rod. Paz MD,  January 04, 2011 6:32 PM  Additional Follow-up for Phone Call Additional follow up Details #1::        Pt is aware. Army Fossa CMA  January 05, 2011 8:28 AM     Prescriptions: OXYCODONE HCL 5 MG CAPS (OXYCODONE HCL) as needed  #60 x 0   Entered by:   Army Fossa CMA   Authorized by:   Nolon Rod. Paz MD   Signed by:   Army Fossa CMA on 01/05/2011   Method used:   Print then Give to Patient   RxID:   [id number]

## 2011-02-01 NOTE — Progress Notes (Signed)
  Phone Note Call from Patient Call back at Work Phone [phone]   Caller: Patient Summary of Call: C/O DIZZINESS SINCE YESTERDAY OV SCHEDULED .Kandice Hams  March 27, 2010 12:29 PM  Initial call taken by: Kandice Hams,  March 27, 2010 12:29 PM

## 2011-02-01 NOTE — Progress Notes (Signed)
Summary: oxy refill  Phone Note Refill Request Message from:  Patient  Refills Requested: Medication #1:  OXYCODONE HCL 5 MG CAPS as needed Pt only received 30 pills last time rx was prescribed. Okay to give an rx for 30 pills? would like to pick up after lunch.  Initial call taken by: Army Fossa CMA,  December 08, 2010 8:23 AM  Follow-up for Phone Call        ok 60, no RF Jose E. Paz MD  December 08, 2010 1:03 PM     Prescriptions: OXYCODONE HCL 5 MG CAPS (OXYCODONE HCL) as needed  #60 x 0   Entered by:   Army Fossa CMA   Authorized by:   Nolon Rod. Paz MD   Signed by:   Army Fossa CMA on 12/08/2010   Method used:   Print then Give to Patient   RxID:   [id number]

## 2011-02-01 NOTE — Medication Information (Signed)
Summary: Margaret [name]  Anticoagulant Therapy  Managed by: Cloyde Reams, RN, BSN PCP: Drue Novel MD, Leane Platt MD: Tenny Craw MD, Gunnar Fusi Indication 1: Deep venous thrombosis Lab Used: [facility] [place] Site: [address] INR POC 2.7 INR RANGE 2.0-3.0  Dietary changes: no    Health status changes: no    Bleeding/hemorrhagic complications: no    Recent/future hospitalizations: no    Any changes in medication regimen? no    Recent/future dental: no  Any missed doses?: no       Is patient compliant with meds? yes       Allergies: No Known Drug Allergies  Anticoagulation Management History:      The patient is taking warfarin and comes in today for a routine follow up visit.  Positive risk factors for bleeding include presence of serious comorbidities.  Negative risk factors for bleeding include an age less than 49 years old.  The bleeding index is 'intermediate risk'.  Negative CHADS2 values include Age > 83 years old.  Her last INR was 1.4.  Anticoagulation responsible Dontez Hauss: Tenny Craw MD, Gunnar Fusi.  INR POC: 2.7.  Cuvette Lot#: [id number].  Exp: 10/2011.    Anticoagulation Management Assessment/Plan:      The patient's current anticoagulation dose is Coumadin 5 mg tabs: as directed.  The target INR is 2.0-3.0.  The next INR is due 10/13/2010.  Anticoagulation instructions were given to patient.  Results were reviewed/authorized by Cloyde Reams, RN, BSN.  She was notified by Cloyde Reams RN.         Prior Anticoagulation Instructions: INR 1.9  Take 2 tablets today, then resume same dosage 7.5mg  daily except 5mg  on Wednesdays.  Recheck in 3 weeks.    Current Anticoagulation Instructions: INR 2.7  Continue on same dosage 7.5mg  daily except 5mg  on Wednesdays.  Recheck in 4 weeks.

## 2011-02-01 NOTE — Progress Notes (Signed)
Summary: oxycodone refill  Phone Note Refill Request Call back at Home Phone [phone]   Refills Requested: Medication #1:  OXYCODONE HCL 5 MG CAPS as needed   Last Refilled: 07/17/2010 would like to pick up today if possible.   Initial call taken by: Army Fossa CMA,  September 15, 2010 2:54 PM  Follow-up for Phone Call        ok 60, no RF Jose E. Paz MD  September 15, 2010 3:00 PM   Additional Follow-up for Phone Call Additional follow up Details #1::        Pt is aware rx is ready. Army Fossa CMA  September 15, 2010 3:13 PM     Prescriptions: OXYCODONE HCL 5 MG CAPS (OXYCODONE HCL) as needed  #60 x 0   Entered by:   Army Fossa CMA   Authorized by:   Nolon Rod. Paz MD   Signed by:   Army Fossa CMA on 09/15/2010   Method used:   Print then Give to Patient   RxID:   [id number]

## 2011-02-01 NOTE — Progress Notes (Signed)
  Phone Note Refill Request Call back at Home Phone [phone] Message from:  Patient  Refills Requested: Medication #1:  OXYCODONE HCL 5 MG CAPS as needed   Last Refilled: 01/02/2010   Notes: #60  Method Requested: Pick up at Office - pt would like to pick up today Initial call taken by: Shary Decamp,  March 08, 2010 12:47 PM  Follow-up for Phone Call        ok 60, no RF Pascale Maves E. Aarionna Germer MD  March 08, 2010 1:28 PM     Prescriptions: OXYCODONE HCL 5 MG CAPS (OXYCODONE HCL) as needed  #60 x 0   Entered by:   Shary Decamp   Authorized by:   Nolon Rod. Trenda Corliss MD   Signed by:   Shary Decamp on 03/08/2010   Method used:   Print then Give to Patient   RxID:   [id number]

## 2011-02-01 NOTE — Medication Information (Signed)
Summary: rov/cb  Anticoagulant Therapy  Managed by: Weston Brass, PharmD PCP: Drue Novel MD, Leane Platt MD: Eden Emms MD, Theron Arista Indication 1: Deep venous thrombosis Lab Used: [facility] [place] Site: [address] INR POC 2.3 INR RANGE 2.0-3.0  Dietary changes: no    Health status changes: no    Bleeding/hemorrhagic complications: no    Recent/future hospitalizations: yes       Details: having a polyp removed from ovary on July 22, physician does not wish to stop coumadin for procedure  Any changes in medication regimen? no    Recent/future dental: no  Any missed doses?: no       Is patient compliant with meds? yes      Comments: instructed patient to call if she was placed on antibiotics after her procedure so her coumadin could be adjusted if needed  Allergies: No Known Drug Allergies  Anticoagulation Management History:      The patient is taking warfarin and comes in today for a routine follow up visit.  Positive risk factors for bleeding include presence of serious comorbidities.  Negative risk factors for bleeding include an age less than 72 years old.  The bleeding index is 'intermediate risk'.  Negative CHADS2 values include Age > 83 years old.  Her last INR was 1.4.  Anticoagulation responsible provider: Eden Emms MD, Theron Arista.  INR POC: 2.3.  Cuvette Lot#: [id number].  Exp: 09/2011.    Anticoagulation Management Assessment/Plan:      The patient's current anticoagulation dose is Coumadin 5 mg tabs: as directed.  The target INR is 2.0-3.0.  The next INR is due 08/04/2010.  Anticoagulation instructions were given to patient.  Results were reviewed/authorized by Weston Brass, PharmD.  She was notified by Dillard Cannon.         Prior Anticoagulation Instructions: INR 2.1. Take 1.5 tablets daily. Recheck in 2 weeks.  Current Anticoagulation Instructions: INR 2.3  Continue same regimen of 1.5 tabs every day.  Recheck in 4 weeks.

## 2011-02-01 NOTE — Medication Information (Signed)
Summary: rov/tm  Anticoagulant Therapy  Managed by: Cloyde Reams, RN, BSN PCP: Drue Novel MD, Leane Platt MD: Antoine Poche MD, Fayrene Fearing Indication 1: Deep venous thrombosis Lab Used: [facility] [place] Site: [address] INR POC 1.9 INR RANGE 2.0-3.0  Dietary changes: no    Health status changes: no    Bleeding/hemorrhagic complications: yes       Details: Belly button bled x 2 days, less now.  1 episode of vaginal bleeding, resolved.   Recent/future hospitalizations: no    Any changes in medication regimen? no    Recent/future dental: no  Any missed doses?: no       Is patient compliant with meds? yes       Allergies: No Known Drug Allergies  Anticoagulation Management History:      The patient is taking warfarin and comes in today for a routine follow up visit.  Positive risk factors for bleeding include presence of serious comorbidities.  Negative risk factors for bleeding include an age less than 66 years old.  The bleeding index is 'intermediate risk'.  Negative CHADS2 values include Age > 51 years old.  Her last INR was 1.4.  Anticoagulation responsible Krystina Strieter: Antoine Poche MD, Fayrene Fearing.  INR POC: 1.9.  Cuvette Lot#: [id number].  Exp: 10/2011.    Anticoagulation Management Assessment/Plan:      The patient's current anticoagulation dose is Coumadin 5 mg tabs: as directed.  The target INR is 2.0-3.0.  The next INR is due 09/15/2010.  Anticoagulation instructions were given to patient.  Results were reviewed/authorized by Cloyde Reams, RN, BSN.  She was notified by Cloyde Reams RN.         Prior Anticoagulation Instructions: INR 3.2  Skip today then change dose to 7.5mg s daily except 5mg s on Wednesdays.  Recheck in 3 weeks.   Current Anticoagulation Instructions: INR 1.9  Take 2 tablets today, then resume same dosage 7.5mg  daily except 5mg  on Wednesdays.  Recheck in 3 weeks.   Prescriptions: COUMADIN 5 MG TABS (WARFARIN SODIUM) as directed Brand medically  necessary #60 x 2   Entered by:   Cloyde Reams RN   Authorized by:   Rollene Rotunda, MD, [facility]   Signed by:   Cloyde Reams RN on 08/25/2010   Method used:   Electronically to        [facility] [address] (retail)       [address]       [place], [place]  [address]       Ph: [phone] or [phone]       Fax: [phone]   RxID:   [id number]

## 2011-02-01 NOTE — Medication Information (Signed)
Summary: rov/tm  Anticoagulant Therapy  Managed by: Cloyde Reams, RN, BSN PCP: Drue Novel MD, Leane Platt MD: Gala Romney MD, Reuel Boom Indication 1: Deep venous thrombosis Lab Used: [facility] [place] Site: [address] INR POC 1.8 INR RANGE 2.0-3.0  Dietary changes: no    Health status changes: no    Bleeding/hemorrhagic complications: no    Recent/future hospitalizations: no    Any changes in medication regimen? no    Recent/future dental: no  Any missed doses?: no       Is patient compliant with meds? yes       Allergies (verified): No Known Drug Allergies  Anticoagulation Management History:      The patient is taking warfarin and comes in today for a routine follow up visit.  Negative risk factors for bleeding include an age less than 67 years old.  The bleeding index is 'low risk'.  Negative CHADS2 values include Age > 77 years old.  Her last INR was 4.2.  Anticoagulation responsible provider: Bensimhon MD, Reuel Boom.  INR POC: 1.8.  Cuvette Lot#: [id number].  Exp: 04/2011.    Anticoagulation Management Assessment/Plan:      The patient's current anticoagulation dose is Coumadin 5 mg tabs: as directed.  The target INR is 2.0-3.0.  The next INR is due 02/03/2010.  Anticoagulation instructions were given to patient.  Results were reviewed/authorized by Cloyde Reams, RN, BSN.  She was notified by Cloyde Reams RN.         Prior Anticoagulation Instructions: INR 1.6 Continue dose to 10mg s everyday except 7.5mg s on Mondays and Wednesdays. recheck in one week.  Current Anticoagulation Instructions: INR 1.8  Take 12.5mg  today then start taking 10mg  daily except 7.5mg  on Wednesdays.  Recheck in 1 week.

## 2011-02-01 NOTE — Progress Notes (Signed)
Summary: triage   Phone Note From Other Clinic Call back at [phone]   Caller: Marisue Ivan, scheduler Call For: Dr. Russella Dar Reason for Call: Schedule Patient Appt Summary of Call: Dr. Alwyn Ren would like pt seen asap for positive hem card and anemia... pt has admitted to GI hx with a Dr. Randa Evens, but Marisue Ivan is unsure of timeline and location Initial call taken by: Vallarie Mare,  June 05, 2010 4:27 PM  Follow-up for Phone Call        This is Marisue Ivan the schedular, dont worry aboutt his anymore, got in touch with the patient and found she is seeing a Jarold Song doctor. Will contact them. Sorry for the mistake.  Follow-up by: Harold Barban,  June 05, 2010 4:30 PM

## 2011-02-02 ENCOUNTER — Telehealth: Payer: Self-pay | Admitting: Internal Medicine

## 2011-02-02 ENCOUNTER — Encounter: Payer: Self-pay | Admitting: Internal Medicine

## 2011-02-02 ENCOUNTER — Ambulatory Visit: Admit: 2011-02-02 | Payer: Self-pay

## 2011-02-02 ENCOUNTER — Encounter (INDEPENDENT_AMBULATORY_CARE_PROVIDER_SITE_OTHER): Payer: 59

## 2011-02-02 DIAGNOSIS — I82409 Acute embolism and thrombosis of unspecified deep veins of unspecified lower extremity: Secondary | ICD-10-CM

## 2011-02-02 DIAGNOSIS — Z7901 Long term (current) use of anticoagulants: Secondary | ICD-10-CM

## 2011-02-02 LAB — CONVERTED CEMR LAB: POC INR: 1.6

## 2011-02-07 NOTE — Medication Information (Signed)
Summary: Margaret [name]   Anticoagulant Therapy  Managed by: Weston Brass, PharmD PCP: Drue Novel MD, Leane Platt MD: Tenny Craw MD, Gunnar Fusi Indication 1: Deep venous thrombosis Lab Used: [facility] [place] Site: [address] INR POC 1.6 INR RANGE 2.0-3.0  Dietary changes: no    Health status changes: no    Bleeding/hemorrhagic complications: no    Recent/future hospitalizations: no    Any changes in medication regimen? no    Recent/future dental: no  Any missed doses?: no       Is patient compliant with meds? yes       Allergies: No Known Drug Allergies  Anticoagulation Management History:      The patient is taking warfarin and comes in today for a routine follow up visit.  Positive risk factors for bleeding include presence of serious comorbidities.  Negative risk factors for bleeding include an age less than 12 years old.  The bleeding index is 'intermediate risk'.  Negative CHADS2 values include Age > 81 years old.  Her last INR was 1.4.  Anticoagulation responsible provider: Tenny Craw MD, Gunnar Fusi.  INR POC: 1.6.  Cuvette Lot#: [id number].  Exp: 01/2012.    Anticoagulation Management Assessment/Plan:      The patient's current anticoagulation dose is Coumadin 5 mg tabs: as directed.  The target INR is 2.0-3.0.  The next INR is due 02/23/2011.  Anticoagulation instructions were given to patient.  Results were reviewed/authorized by Weston Brass, PharmD.  She was notified by Margot Chimes PharmD Candidate.         Prior Anticoagulation Instructions: INR 2.4 (INR goal:  2-3)  Continue current Coumadin regimen of 1 and 1/2 tablets on Sundays, Mondays, Tuesdays, Thursdays, Fridays, and Saturdays and 1 tablet on Wednesdays.    Current Anticoagulation Instructions: INR 1.6  Take 2 1/2 tablets today then resume current Coumadin dose of 1 1/2 tablets everyday except 1 tablet on Wednesdays.   Prescriptions: COUMADIN 5 MG TABS (WARFARIN SODIUM) as directed  #45 x 3   Entered by:    Kristina Hazard PharmD   Authorized by:   Macio Kissoon Virginia Hudson Majkowski, MD, FACC   Signed by:   Kristina Hazard PharmD on 02/02/2011   Method used:   Electronically to        [facility] [address] (retail)       [address]       [place], [place]  [address]       Ph: [phone] or [phone]       Fax: [phone]   RxID:   [id number]

## 2011-02-07 NOTE — Progress Notes (Signed)
Summary: Oxy refill  Phone Note Refill Request Message from:  Patient on February 02, 2011 8:29 AM  Refills Requested: Medication #1:  OXYCODONE HCL 5 MG CAPS as needed   Last Refilled: 01/05/2011  Method Requested: Pick up at Office Initial call taken by: Army Fossa CMA,  February 02, 2011 8:31 AM  Follow-up for Phone Call        ok 60, no RF Follow-up by: Nolon Rod. Paz MD,  February 02, 2011 1:21 PM    Prescriptions: OXYCODONE HCL 5 MG CAPS (OXYCODONE HCL) as needed  #60 x 0   Entered by:   Doristine Devoid CMA   Authorized by:   Nolon Rod. Paz MD   Signed by:   Doristine Devoid CMA on 02/02/2011   Method used:   Print then Give to Patient   RxID:   [id number]

## 2011-02-19 ENCOUNTER — Ambulatory Visit: Payer: 59 | Admitting: Internal Medicine

## 2011-02-20 ENCOUNTER — Encounter: Payer: Self-pay | Admitting: Internal Medicine

## 2011-02-20 ENCOUNTER — Other Ambulatory Visit: Payer: Self-pay | Admitting: Internal Medicine

## 2011-02-20 ENCOUNTER — Ambulatory Visit (INDEPENDENT_AMBULATORY_CARE_PROVIDER_SITE_OTHER): Payer: 59 | Admitting: Internal Medicine

## 2011-02-20 DIAGNOSIS — R5383 Other fatigue: Secondary | ICD-10-CM

## 2011-02-20 DIAGNOSIS — D649 Anemia, unspecified: Secondary | ICD-10-CM

## 2011-02-20 DIAGNOSIS — R5381 Other malaise: Secondary | ICD-10-CM

## 2011-02-20 LAB — B12 AND FOLATE PANEL: Folate: 13 ng/mL (ref >5.9)

## 2011-02-20 LAB — IBC PANEL
Iron: 53 ug/dL (ref 42–145)
Transferrin: 242.8 mg/dL (ref 212.0–360.0)

## 2011-02-20 LAB — HEPATIC FUNCTION PANEL
ALT: 39 U/L — ABNORMAL HIGH (ref 0–35)
AST: 52 U/L — ABNORMAL HIGH (ref 0–37)
Total Bilirubin: 0.4 mg/dL (ref 0.3–1.2)
Total Protein: 8.6 g/dL — ABNORMAL HIGH (ref 6.0–8.3)

## 2011-02-20 LAB — CBC WITH DIFFERENTIAL/PLATELET
Basophils Relative: 0.5 % (ref 0.0–3.0)
Eosinophils Relative: 1.3 % (ref 0.0–5.0)
HCT: 41.1 % (ref 36.0–46.0)
Hemoglobin: 14.2 g/dL (ref 12.0–15.0)
Lymphs Abs: 1.6 10*3/uL (ref 0.7–4.0)
MCV: 96 fl (ref 78.0–100.0)
Monocytes Absolute: 0.8 10*3/uL (ref 0.1–1.0)
Monocytes Relative: 14.3 % — ABNORMAL HIGH (ref 3.0–12.0)
Neutro Abs: 3.2 10*3/uL (ref 1.4–7.7)
RBC: 4.28 Mil/uL (ref 3.87–5.11)
WBC: 5.7 10*3/uL (ref 4.5–10.5)

## 2011-02-20 LAB — BASIC METABOLIC PANEL
BUN: 9 mg/dL (ref 6–23)
Chloride: 105 mEq/L (ref 96–112)
GFR: 95.25 mL/min (ref >60.00)
Potassium: 4.4 mEq/L (ref 3.5–5.1)
Sodium: 137 mEq/L (ref 135–145)

## 2011-02-21 ENCOUNTER — Encounter: Payer: Self-pay | Admitting: Internal Medicine

## 2011-02-22 DIAGNOSIS — I82409 Acute embolism and thrombosis of unspecified deep veins of unspecified lower extremity: Secondary | ICD-10-CM

## 2011-02-23 ENCOUNTER — Encounter (INDEPENDENT_AMBULATORY_CARE_PROVIDER_SITE_OTHER): Payer: 59

## 2011-02-23 ENCOUNTER — Encounter: Payer: Self-pay | Admitting: Internal Medicine

## 2011-02-23 DIAGNOSIS — I824Y9 Acute embolism and thrombosis of unspecified deep veins of unspecified proximal lower extremity: Secondary | ICD-10-CM

## 2011-02-23 DIAGNOSIS — Z7901 Long term (current) use of anticoagulants: Secondary | ICD-10-CM

## 2011-02-27 NOTE — Assessment & Plan Note (Signed)
Summary: Discuss vitamin deficiency/diet/going into deep sleep after e...   Vital Signs:  Patient profile:   36 year old female Weight:      269.50 pounds Pulse rate:   80 / minute Pulse rhythm:   regular BP sitting:   124 / 80  (left arm) Cuff size:   large  Vitals Entered By: Army Fossa CMA (February 20, 2011 11:08 AM) CC: Pt here states she has been overly tired  Comments started a few months ago, getting worse After eating she falls asleep 30 mins after eating [employer]    History of Present Illness:  more tired than usual x 4 months, falling a sleep easily; patient not working x 8 months, less active . Denies falling asleep driving  symptoms definitely worse within 30 min of meals   ROS:  no N-V-D no abd pain or blood in stools periods are erratic but usually prolonged and heavy denies snoring perse  some strss due to lack of job, but overall mood ok  no wt loss no CP-SOB  Current Medications (verified): 1)  Coumadin 5 Mg Tabs (Warfarin Sodium) .... As Directed 2)  Baby Aspirin 81 Mg Chew (Aspirin) 3)  Azathioprine 50 Mg Tabs (Azathioprine) .... Take 2 1/2 Tablets Daily 4)  Iron 25 Mg Tabs (Iron) 5)  Prilosec 20 Mg Cpdr (Omeprazole) .... As Needed 6)  Oxycodone Hcl 5 Mg Caps (Oxycodone Hcl) .... As Needed 7)  Astepro 0.15 % Soln (Azelastine Hcl) .... 2 Sprays On Each Side of The Nose Two Times A Day As Needed  Allergies (verified): No Known Drug Allergies  Past History:  Past Medical History: Reviewed history from 06/02/2010 and no changes required. DVT, hx of (1996) DVT, hx of (2000) GERD OBESITY, MORBID  ANTICARDIOLIPIN ANTIBODY SYNDROME  HEPATITIS AUTOINMUNE  Abnormal menses  Past Surgical History: Reviewed history from 06/02/2010 and no changes required. D&C approx 11/2008  Social History: Married one child quit tobacco in December 2010 not working at present   Physical Exam  General:  alert, well-developed, and morbidly  overweight  Neck:  no JVD @ 45  Lungs:  Normal respiratory effort, chest expands symmetrically. Lungs are clear to auscultation, no crackles or wheezes. Heart:  normal rate, regular rhythm, no murmur, and no gallop.   Abdomen:  soft, non-tender, no distention, no masses, no guarding, and no rigidity.   Extremities:  no pitting edema    Impression & Recommendations:  Problem # 1:  FATIGUE (ICD-780.79) Assessment Deteriorated severe fatigue and falling  asleep easily she is high risk for OSA (although reports no snoring that she knows) h/o anemia as well plan: ApneaLink to screen for osa labs re asses in 53month  Orders: Venipuncture (16109) TLB-BMP (Basic Metabolic Panel-BMET) (80048-METABOL) TLB-Hepatic/Liver Function Pnl (80076-HEPATIC) T-Vitamin D (25-Hydroxy) (60454-09811) Pulmonary Referral (Pulmonary)  Problem # 2:  ANEMIA (ICD-285.9)  see #1 Her updated medication list for this problem includes:    Iron 25 Mg Tabs (Iron)  Orders: TLB-CBC Platelet - w/Differential (85025-CBCD) TLB-IBC Pnl (Iron/FE;Transferrin) (83550-IBC) TLB-B12 + Folate Pnl (91478_29562-Z30/QMV)  Complete Medication List: 1)  Coumadin 5 Mg Tabs (Warfarin sodium) .... As directed 2)  Baby Aspirin 81 Mg Chew (Aspirin) 3)  Azathioprine 50 Mg Tabs (Azathioprine) .... Take 2 1/2 tablets daily 4)  Iron 25 Mg Tabs (Iron) 5)  Prilosec 20 Mg Cpdr (Omeprazole) .... As needed 6)  Oxycodone Hcl 5 Mg Caps (Oxycodone hcl) .... As needed 7)  Astepro 0.15 % Soln (Azelastine hcl) .Marland KitchenMarland KitchenMarland Kitchen  2 sprays on each side of the nose two times a day as needed  Patient Instructions: 1)  Please schedule a follow-up appointment in 1 month.    Orders Added: 1)  Venipuncture [36415] 2)  TLB-BMP (Basic Metabolic Panel-BMET) [80048-METABOL] 3)  TLB-CBC Platelet - w/Differential [85025-CBCD] 4)  TLB-Hepatic/Liver Function Pnl [80076-HEPATIC] 5)  TLB-IBC Pnl (Iron/FE;Transferrin) [83550-IBC] 6)  TLB-B12 + Folate Pnl  [82746_82607-B12/FOL] 7)  T-Vitamin D (25-Hydroxy) [40981-19147] 8)  Pulmonary Referral [Pulmonary] 9)  Est. Patient Level IV [82956]

## 2011-02-27 NOTE — Medication Information (Signed)
Summary: rov/ejm   Anticoagulant Therapy  Managed by: Georgina Pillion, PharmD PCP: Drue Novel MD, Leane Platt MD: Tenny Craw MD, Gunnar Fusi Indication 1: Deep venous thrombosis Lab Used: [facility] [place] Site: [address] INR POC 2.3 INR RANGE 2.0-3.0  Dietary changes: no    Health status changes: no    Bleeding/hemorrhagic complications: no    Recent/future hospitalizations: no    Any changes in medication regimen? yes       Details: Starting taking Vitamin D  Recent/future dental: no  Any missed doses?: no       Is patient compliant with meds? yes       Allergies: No Known Drug Allergies  Anticoagulation Management History:      Positive risk factors for bleeding include presence of serious comorbidities.  Negative risk factors for bleeding include an age less than 26 years old.  The bleeding index is 'intermediate risk'.  Negative CHADS2 values include Age > 49 years old.  Her last INR was 1.4.  Anticoagulation responsible provider: Tenny Craw MD, Gunnar Fusi.  INR POC: 2.3.  Cuvette Lot#: [id number].  Exp: 01/2012.    Anticoagulation Management Assessment/Plan:      The patient's current anticoagulation dose is Coumadin 5 mg tabs: as directed.  The target INR is 2.0-3.0.  The next INR is due 03/23/2011.  Anticoagulation instructions were given to patient.  Results were reviewed/authorized by Georgina Pillion, PharmD.         Prior Anticoagulation Instructions: INR 1.6  Take 2 1/2 tablets today then resume current Coumadin dose of 1 1/2 tablets everyday except 1 tablet on Wednesdays.    Current Anticoagulation Instructions: Continue current regimen of 1 1/2 tablets (7.5 mg) daily EXCEPT for 1 tablet (5 mg) on Wednesdays only.  INR 2.3

## 2011-03-02 ENCOUNTER — Telehealth: Payer: Self-pay | Admitting: Internal Medicine

## 2011-03-08 NOTE — Progress Notes (Signed)
Summary: oxycodone refill  Phone Note Refill Request Message from:  Patient on March 02, 2011 9:04 AM  Refills Requested: Medication #1:  OXYCODONE HCL 5 MG CAPS as needed   Last Refilled: 02/02/2011  Method Requested: Pick up at Office Initial call taken by: Army Fossa CMA,  March 02, 2011 9:04 AM  Follow-up for Phone Call        ok 60, no RF Jose E. Paz MD  March 02, 2011 11:25 AM   Additional Follow-up for Phone Call Additional follow up Details #1::        Pt is aware that rx is ready for pick up. Army Fossa CMA  March 02, 2011 11:28 AM     Prescriptions: OXYCODONE HCL 5 MG CAPS (OXYCODONE HCL) as needed  #60 x 0   Entered by:   Army Fossa CMA   Authorized by:   Nolon Rod. Paz MD   Signed by:   Army Fossa CMA on 03/02/2011   Method used:   Print then Give to Patient   RxID:   [id number]

## 2011-03-09 ENCOUNTER — Encounter: Payer: Self-pay | Admitting: Pulmonary Disease

## 2011-03-12 ENCOUNTER — Encounter: Payer: Self-pay | Admitting: Pulmonary Disease

## 2011-03-12 ENCOUNTER — Ambulatory Visit (INDEPENDENT_AMBULATORY_CARE_PROVIDER_SITE_OTHER): Payer: 59 | Admitting: Pulmonary Disease

## 2011-03-17 LAB — CBC
Hemoglobin: 10.8 g/dL — ABNORMAL LOW (ref 12.0–15.0)
MCH: 26.5 pg (ref 26.0–34.0)
MCV: 80.9 fL (ref 78.0–100.0)
Platelets: 244 10*3/uL (ref 150–400)
RBC: 4.07 MIL/uL (ref 3.87–5.11)
WBC: 5.8 10*3/uL (ref 4.0–10.5)

## 2011-03-17 LAB — HCG, SERUM, QUALITATIVE: Preg, Serum: NEGATIVE

## 2011-03-19 ENCOUNTER — Telehealth: Payer: Self-pay | Admitting: Internal Medicine

## 2011-03-20 NOTE — Assessment & Plan Note (Signed)
Summary: [facility]PENA LINK ONLY APPT/CB   Allergies: No Known Drug Allergies   Other Orders: Sleep Std Airflow/Heartrate and O2 SAT unattended (16109)  Appended Document: APENA LINK ONLY APPT/CB     Allergies: No Known Drug Allergies        Sleep Study  Procedure date:  03/12/2011  Findings:      Apnea link results FLow evaluation time 7h 54 m AHI 11/h ODI 18/h Lowest desatn 77% 21 min satn < 88% This is consistent with mild- moderate obstructive sleep apnea. Note that portable study can underestimate sleep disordered breathing  Appended Document: APENA LINK ONLY APPT/CB arrange for a pulmonary consult, dx mild OSA  Appended Document: Orders Update     Clinical Lists Changes  Problems: Added new problem of OBSTRUCTIVE SLEEP APNEA (ICD-327.23) Orders: Added new Referral order of Pulmonary Referral (Pulmonary) - Signed

## 2011-03-21 ENCOUNTER — Ambulatory Visit: Payer: 59 | Admitting: Internal Medicine

## 2011-03-22 ENCOUNTER — Encounter: Payer: Self-pay | Admitting: Internal Medicine

## 2011-03-23 ENCOUNTER — Encounter: Payer: Self-pay | Admitting: Internal Medicine

## 2011-03-23 ENCOUNTER — Encounter: Payer: 59 | Admitting: *Deleted

## 2011-03-23 ENCOUNTER — Ambulatory Visit (INDEPENDENT_AMBULATORY_CARE_PROVIDER_SITE_OTHER): Payer: 59 | Admitting: Internal Medicine

## 2011-03-23 VITALS — BP 128/86 | HR 75 | Wt 268.0 lb

## 2011-03-23 DIAGNOSIS — G4730 Sleep apnea, unspecified: Secondary | ICD-10-CM | POA: Insufficient documentation

## 2011-03-23 DIAGNOSIS — G4733 Obstructive sleep apnea (adult) (pediatric): Secondary | ICD-10-CM

## 2011-03-23 DIAGNOSIS — R5383 Other fatigue: Secondary | ICD-10-CM

## 2011-03-23 DIAGNOSIS — R5381 Other malaise: Secondary | ICD-10-CM

## 2011-03-23 MED ORDER — OXYCODONE HCL 5 MG PO CAPS: 5.0000 mg | ORAL_CAPSULE | Freq: Two times a day (BID) | ORAL | Status: DC | PRN

## 2011-03-23 NOTE — Assessment & Plan Note (Addendum)
Long discussion about diet-exercise she reports that her diet is very healthy and moderate. refer to nutritionist Barry Dienes)  To see if we can improve her intake.  recommend both aerobic and weight-bearing exercises  Today , I spent more than  Min 15  with the patient, >50% of the time counseling, and /or reviewing the chart and labs

## 2011-03-23 NOTE — Assessment & Plan Note (Addendum)
Recently seen with fatigue, workup negative except for low vitamin D. She is on vitamin D supplements, feeling some better. +OSA, refered to pulm.  Reassess in a few months

## 2011-03-23 NOTE — Progress Notes (Signed)
  Subjective:    Patient ID: Margaret [name], female    DOB: 1975-09-13, 36 y.o.   MRN: [id number]  HPI F/u from last OV,  She complained of fatigue, all blood work was negative except for a low vitamin D. We did to apnealink ,showed  mild to moderate sleep apnea.   Review of Systems   since the last office visit, she is taking her vitamin D, feels somehow better  Past Medical History  Diagnosis Date  . DVT (deep venous thrombosis) 1996, 2000    h/o  . GERD (gastroesophageal reflux disease)   . Obesity, morbid   . Anticardiolipin antibody syndrome   . Hepatitis, autoimmune   . Abnormal menses   . Sleep apnea      mild to moderate  per apnea link y 3-12   Social History: Married one child quit tobacco in December 2010 not working at present        Objective:   Physical Exam  Constitutional: She appears well-developed.        Overweight appearing  Psychiatric: She has a normal mood and affect. Her behavior is normal. Judgment and thought content normal.          Assessment & Plan:

## 2011-03-24 NOTE — Assessment & Plan Note (Signed)
Apnea link 03-2011  + for mild-moderate OSA

## 2011-03-26 ENCOUNTER — Ambulatory Visit (INDEPENDENT_AMBULATORY_CARE_PROVIDER_SITE_OTHER): Payer: 59 | Admitting: *Deleted

## 2011-03-26 DIAGNOSIS — I82409 Acute embolism and thrombosis of unspecified deep veins of unspecified lower extremity: Secondary | ICD-10-CM

## 2011-03-26 DIAGNOSIS — Z7901 Long term (current) use of anticoagulants: Secondary | ICD-10-CM | POA: Insufficient documentation

## 2011-03-26 NOTE — Patient Instructions (Signed)
INR 1.8 Continue taking 1 1/2 tablets (7.5 mg) daily, except take 1 tablet (5 mg) on Wednesdays. Recheck in 3 weeks.

## 2011-03-29 NOTE — Progress Notes (Signed)
Summary: Pulmonary Referral  Phone Note Call from Patient Call back at Home Phone [phone]   Caller: Patient Summary of Call: I called pt with her appt information with [facility]. Pt seemed really confused as to why she has a pulmonary referral. Please clerify as to exactly why the patient is going there. Initial call taken by: Lavell Islam,  March 19, 2011 1:49 PM  Follow-up for Phone Call        I called pt and discussed w/ her that her results showed Mild OSA- she would like to see Dr.Paz first before seeing Pulmonary. Army Fossa CMA  March 19, 2011 2:20 PM

## 2011-04-03 ENCOUNTER — Institutional Professional Consult (permissible substitution): Payer: 59 | Admitting: Pulmonary Disease

## 2011-04-09 LAB — PROTEIN S, TOTAL: Protein S Ag, Total: 54 % — ABNORMAL LOW (ref 70–140)

## 2011-04-09 LAB — LIPID PANEL
Cholesterol: 103 mg/dL (ref 0–200)
HDL: 14 mg/dL — ABNORMAL LOW (ref >39)
LDL Cholesterol: 64 mg/dL (ref 0–99)
Total CHOL/HDL Ratio: 7.4 RATIO

## 2011-04-09 LAB — TSH: TSH: 3.743 u[IU]/mL (ref 0.350–4.500)

## 2011-04-09 LAB — CARDIOLIPIN ANTIBODIES, IGG, IGM, IGA
Anticardiolipin IgA: 9 [APL'U] — ABNORMAL LOW (ref <13)
Anticardiolipin IgM: 14 [MPL'U] (ref <10)

## 2011-04-09 LAB — HOMOCYSTEINE: Homocysteine: 4.9 umol/L (ref 4.0–15.4)

## 2011-04-09 LAB — ANTI-NUCLEAR AB-TITER (ANA TITER): ANA Titer 1: 1:40 {titer} — ABNORMAL HIGH

## 2011-04-09 LAB — PROTEIN S ACTIVITY: Protein S Activity: 11 % — ABNORMAL LOW (ref 69–129)

## 2011-04-09 LAB — PROTIME-INR: INR: 3.6 — ABNORMAL HIGH (ref 0.00–1.49)

## 2011-04-09 LAB — COMPREHENSIVE METABOLIC PANEL
ALT: 169 U/L — ABNORMAL HIGH (ref 0–35)
AST: 144 U/L — ABNORMAL HIGH (ref 0–37)
Albumin: 2.2 g/dL — ABNORMAL LOW (ref 3.5–5.2)
Calcium: 7.6 mg/dL — ABNORMAL LOW (ref 8.4–10.5)
GFR calc Af Amer: >60 mL/min (ref >60)
Potassium: 3.5 mEq/L (ref 3.5–5.1)
Sodium: 134 mEq/L — ABNORMAL LOW (ref 135–145)
Total Protein: 7.5 g/dL (ref 6.0–8.3)

## 2011-04-09 LAB — LUPUS ANTICOAGULANT PANEL
DRVVT: 50.8 secs — ABNORMAL HIGH (ref 36.1–47.0)
PTT Lupus Anticoagulant: 63.7 secs — ABNORMAL HIGH (ref 36.3–48.8)
PTTLA 4:1 Mix: 49.6 secs — ABNORMAL HIGH (ref 36.3–48.8)
PTTLA Confirmation: 2.5 secs (ref <8.0)

## 2011-04-09 LAB — CBC
MCHC: 33 g/dL (ref 30.0–36.0)
RDW: 18.9 % — ABNORMAL HIGH (ref 11.5–15.5)

## 2011-04-09 LAB — BETA-2-GLYCOPROTEIN I ABS, IGG/M/A
Beta-2 Glyco I IgG: 9 U/mL (ref <20)
Beta-2-Glycoprotein I IgA: 7 U/mL (ref <10)
Beta-2-Glycoprotein I IgM: 9 U/mL (ref <10)

## 2011-04-09 LAB — URINE CULTURE
Colony Count: 2000
Special Requests: NEGATIVE

## 2011-04-09 LAB — ANA: Anti Nuclear Antibody(ANA): POSITIVE — AB

## 2011-04-09 LAB — FACTOR 5 LEIDEN

## 2011-04-09 LAB — CARDIAC PANEL(CRET KIN+CKTOT+MB+TROPI): Total CK: 126 U/L (ref 7–177)

## 2011-04-10 LAB — DIFFERENTIAL
Eosinophils Absolute: 0.1 10*3/uL (ref 0.0–0.7)
Eosinophils Relative: 2 % (ref 0–5)
Lymphs Abs: 1.7 10*3/uL (ref 0.7–4.0)
Monocytes Absolute: 0.7 10*3/uL (ref 0.1–1.0)
Monocytes Relative: 13 % — ABNORMAL HIGH (ref 3–12)

## 2011-04-10 LAB — CBC
HCT: 34.5 % — ABNORMAL LOW (ref 36.0–46.0)
Hemoglobin: 11.4 g/dL — ABNORMAL LOW (ref 12.0–15.0)
MCV: 83.6 fL (ref 78.0–100.0)
RBC: 4.12 MIL/uL (ref 3.87–5.11)
WBC: 5.5 10*3/uL (ref 4.0–10.5)

## 2011-04-10 LAB — URINE MICROSCOPIC-ADD ON

## 2011-04-10 LAB — BASIC METABOLIC PANEL
CO2: 26 mEq/L (ref 19–32)
Chloride: 108 mEq/L (ref 96–112)
GFR calc Af Amer: >60 mL/min (ref >60)
Potassium: 3.7 mEq/L (ref 3.5–5.1)

## 2011-04-10 LAB — URINALYSIS, ROUTINE W REFLEX MICROSCOPIC
Ketones, ur: NEGATIVE mg/dL
Nitrite: NEGATIVE
Specific Gravity, Urine: 1.028 (ref 1.005–1.030)
Urobilinogen, UA: 1 mg/dL (ref 0.0–1.0)

## 2011-04-10 LAB — PROTIME-INR: INR: 3.6 — ABNORMAL HIGH (ref 0.00–1.49)

## 2011-04-10 LAB — PREGNANCY, URINE: Preg Test, Ur: NEGATIVE

## 2011-04-10 LAB — POCT CARDIAC MARKERS: Myoglobin, poc: 67.9 ng/mL (ref 12–200)

## 2011-04-16 ENCOUNTER — Ambulatory Visit (INDEPENDENT_AMBULATORY_CARE_PROVIDER_SITE_OTHER): Payer: 59 | Admitting: *Deleted

## 2011-04-16 DIAGNOSIS — I82409 Acute embolism and thrombosis of unspecified deep veins of unspecified lower extremity: Secondary | ICD-10-CM

## 2011-04-26 ENCOUNTER — Other Ambulatory Visit: Payer: Self-pay | Admitting: *Deleted

## 2011-04-26 ENCOUNTER — Institutional Professional Consult (permissible substitution): Payer: 59 | Admitting: Pulmonary Disease

## 2011-04-26 MED ORDER — OXYCODONE HCL 5 MG PO CAPS: 5.0000 mg | ORAL_CAPSULE | Freq: Two times a day (BID) | ORAL | Status: DC | PRN

## 2011-04-26 NOTE — Telephone Encounter (Signed)
Ok 60, no RF 

## 2011-04-26 NOTE — Telephone Encounter (Signed)
Pt is aware.  

## 2011-05-14 ENCOUNTER — Ambulatory Visit (INDEPENDENT_AMBULATORY_CARE_PROVIDER_SITE_OTHER): Payer: 59 | Admitting: *Deleted

## 2011-05-14 DIAGNOSIS — I82409 Acute embolism and thrombosis of unspecified deep veins of unspecified lower extremity: Secondary | ICD-10-CM

## 2011-05-14 LAB — POCT INR: INR: 2.2

## 2011-05-15 ENCOUNTER — Encounter: Payer: Self-pay | Admitting: Pulmonary Disease

## 2011-05-15 NOTE — H&P (Signed)
NAME:  Margaret [name], Margaret [name]               ACCOUNT NO.:  [id number]   MEDICAL RECORD NO.:  [id number]          PATIENT TYPE:  INP   LOCATION:  1434                         FACILITY[facility]  [facility]   PHYSICIAN:  Michiel Cowboy, MDDATE OF BIRTH:  10-16-1975   DATE OF ADMISSION:  05/30/2009  DATE OF DISCHARGE:                              HISTORY & PHYSICAL   CHIEF COMPLAINT:  Right leg swelling and pain.   The patient is a 36 year old female with history of DVT and PEs in the  past, currently on Coumadin and therapeutic.  Presents with 4 days'  history of right leg pain and progressive swelling and redness, some  occasional chills, otherwise no other complaints except for slightly  worsening shortness of breath than usual.  The patient denies any long  rides.  She is a fairly active individual.  On Thursday she noticed a  small bump and redness that progressively increased as well as her pain  has increased, at which point she presents to [facility]  [facility].  Unfortunately, Dopplers were not able to be obtained there so she  was admitted to [facility] for further workup.  The patient INR  currently 3.6.  She reports being seen by Dr. Cyndie Chime for recurrent  DVTs.  Of note, her hypercoagulable panel in 2005 was unremarkable.  Otherwise, review of systems unremarkable except per HPI.   PAST MEDICAL HISTORY:  1. Significant for autoimmune hepatitis since 19.  2. Recurrent DVTs and episode of PEs since 19 as well.  3. Dysfunctional bleeding.  4. GERD.   SOCIAL HISTORY:  The patient smokes about a half-pack a day.  Drinks  occasional alcohol.  Does not use drugs.  Lives with her husband, who is  very supportive.   FAMILY HISTORY:  The patient is adopted.   ALLERGIES:  No known drug allergies.   MEDICATIONS:  She is currently taking:  1. Coumadin 10 mg on all days except Wednesdays, when she takes 15 mg.  2. Iron.  3. Prilosec.  4. Percocet as needed for back pain.  5.  Azathioprine 125 mg p.o. daily.   VITAL SIGNS:  Temperature 98.0, blood pressure 140/90, respirations 20,  saturating 95% on room air, heart rate 80s.  The patient appears to be currently in no acute distress.  HEAD:  Nontraumatic.  Moist mucous membranes.  LUNGS:  Clear to auscultation bilaterally.  HEART:  Regular rate and rhythm.  No murmurs, rubs or gallops.  ABDOMEN:  Obese but nontender, nondistended.  LOWER EXTREMITIES:  Obese but with slight reddening of the skin over  back of right calf.  No generalized swelling or edema noted.  Good  pulses.  There is some warmth in the back of her calf as well.  The  erythema was circled.  SKIN:  As above.  NEUROLOGIC:  The patient is intact.   LABORATORY DATA:  Dietz blood cell count 3.5, hemoglobin 11.4.  Sodium  141, potassium 3.7, creatinine 0.7.  UA showing 11-20 Abe blood cells  and many bacteria.  CT scan of the chest showing no PE.  EKG showing  no  evidence of cardiac injury, ischemia.  Cardiac enzymes negative.  INR  3.6.   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  Presents with right  leg erythema and swelling with differential including deep vein  thrombosis versus cellulitis.  1. Question of cellulitis.  Cover for now with vancomycin.  Would      avoid Levaquin if possible to avoid interfering with Coumadin.  2. Urinary tract infection.  We will cover with Rocephin.  Await urine      culture.  3. Questionable deep vein thrombosis with recurrent history of deep      vein thromboses in the past.  We obtained Dopplers.  The patient is      already on Coumadin and therapeutic, actually supratherapeutic.      Will not initiate on Lovenox as this would only increase her risk      of bleeding.  If the patient truly has deep vein thrombosis despite      being therapeutic on Coumadin, consider inferior vena cava filter.      The patient is followed by  Dr. Cyndie Chime.  If the patient has      deep vein thrombosis, we will let him know about this and see what      recommendations he would have.  The patient had negative      hypercoagulable workup in 2005.  I wonder if she would benefit from      repeating this to see if anything has shown up now.  We will check      ANA as she has a history of autoimmune hepatitis.  4. Prophylaxis.  Protonix, plus patient already on Coumadin.      Michiel Cowboy, MD  Electronically Signed     AVD/MEDQ  D:  05/30/2009  T:  05/31/2009  Job:  [id number]   cc:   Willow Ora, MD  [phone] [address] [place], [place] [address]

## 2011-05-15 NOTE — Discharge Summary (Signed)
NAME:  Margaret [name], Margaret [name]               ACCOUNT NO.:  [id number]   MEDICAL RECORD NO.:  [id number]          PATIENT TYPE:  INP   LOCATION:  1434                         [facility]ACILITY:  [facility]   PHYSICIAN:  [name], MD    DATE OF BIRTH:  1975/01/12   DATE OF ADMISSION:  05/30/2009  DATE OF DISCHARGE:  06/01/2009                               DISCHARGE SUMMARY   REASON FOR ADMISSION:  Right leg swelling.   HISTORY OF PRESENT ILLNESS:  A 36 year old woman admitted by [name]  on with right leg swelling.  Please refer to her dictated history and  physical for details.   HOSPITAL COURSE:  The patient was admitted and Doppler study of the  right leg was negative for new DVT.  Her Coumadin was continued during  her hospitalization.  She was treated with intravenous antibiotics, and  her swelling and erythema on the right posterior leg improved  significantly.   By 06/01/2009, she was alert, oriented, getting out of bed and feeling  better, and was thus discharged home in good condition..  One more  stented.  Ostial course which is the first   DISCHARGE DIAGNOSES:  1. Slight right leg cellulitis.  2. Otherwise same as on history and physical.   MEDICATIONS:  1. Ceftin 250 mg twice a day for a week.  2. Otherwise same as on history and physical.   FOLLOW UP:  Dr. Drue Novel in 1 week.  No limitation on diet except weight loss  is advised.   ACTIVITY:  Keep right leg elevated for three more days.      [name], MD  Electronically Signed     SAE/MEDQ  D:  06/01/2009  T:  06/01/2009  Job:  [id number]

## 2011-05-15 NOTE — Op Note (Signed)
NAME:  Margaret [name], Margaret [name]               ACCOUNT NO.:  [id number]   MEDICAL RECORD NO.:  [id number]          PATIENT TYPE:  AMB   LOCATION:  SDC                           FACILITY:  [facility]   PHYSICIAN:  Juluis Mire, M.D.   DATE OF BIRTH:  02-28-1975   DATE OF PROCEDURE:  DATE OF DISCHARGE:                               OPERATIVE REPORT   PREOPERATIVE DIAGNOSIS:  Dysfunctional uterine bleeding.   POSTOPERATIVE DIAGNOSIS:  Dysfunctional uterine bleeding.   PROCEDURE:  Dilatation and curettage.   SURGEON:  Juluis Mire, MD   ANESTHESIA:  General.   ESTIMATED BLOOD LOSS:  100 mL.   PACKS AND DRAINS:  None.   INTRAOPERATIVE BLOOD PLACED:  None.   COMPLICATIONS:  None.   INDICATIONS:  As dictated in history and physical.   PROCEDURE:  The patient was taken to the OR, placed in supine position.  Subsequently, after sedation, he was placed in the dorsal lithotomy  position.  The patient then draped sterile field.  A spec was placed.  The vaginal vault, cervix, and vagina cleansed with Betadine.  The  patient began uncontrollable coughing, required subsequent LMA  intubation.  Subsequently, a speculum was then placed back in the  vaginal vault.  Cervix grasped with single-tooth tenaculum.  Uterus  sounded 8 cm.  Cervix serially dilated to a size 29 Pratt dilator.  The  curette was introduced.  Intrauterine contents were curetted.  We did  obtain moderate amount of tissue, this was sent for pathology.  We will  continue curetting until no additional tissue was obtained and all  quadrants were clear.  We did exploration with the Randall stone forceps  revealing no additional tissue.  Repeat curetting revealed no additional  tissue.  It seemed like the bleeding are pretty much slowed or stopped  at this point.  No signs of perforation or complication.  At this point,  the single-tooth tenaculum was packed, was then removed.  The patient  was taken out of the dorsal supine position, once  alert and extubated,  transferred to recovery room in good condition.  Sponge and needle count  was correct by circulating nurse.     Juluis Mire, M.D.  Electronically Signed    JSM/MEDQ  D:  12/03/2008  T:  12/04/2008  Job:  [id number]

## 2011-05-15 NOTE — H&P (Signed)
NAME:  Margaret [name], Margaret [name]               ACCOUNT NO.:  [id number]   MEDICAL RECORD NO.:  [id number]          PATIENT TYPE:  AMB   LOCATION:  SDC                           FACILITY:  [facility]   PHYSICIAN:  Juluis Mire, M.D.   DATE OF BIRTH:  1975/06/22   DATE OF ADMISSION:  12/03/2008  DATE OF DISCHARGE:                              HISTORY & PHYSICAL   HISTORY OF PRESENT ILLNESS:  The patient is a 36 year old nulligravida  female, with longstanding history of anovulatory cycling and associated  dysfunctional bleeding.  We had been cycled with Prometrium which she  discontinued in 2000.  She had no bleeding until March of this year and  also had a prolonged episode of bleeding.  She was seen in the emergency  room and placed on Provera.  Initially slowed down but now the bleeding  has picked back up.  We did do an ultrasound in the office that showed  markedly thickened endometrium.  In view of this, she now presents for a  D&C.   Her course is complicated by her past history.  She does have a past  history of possible autoimmune hepatitis.  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 Cyndie Chime.   ALLERGIES:  She has no known drug allergies.   MEDICATIONS:  Coumadin, Prilosec and Imuran.   PAST MEDICAL HISTORY:  Significant for a history of possible autoimmune  hepatitis and a hypercoagulable state.   SURGICAL HISTORY:  She has had no previous surgeries noted.  No  obstetrical history.   FAMILY HISTORY:  Noncontributory.   SOCIAL HISTORY:  No tobacco or alcohol use.   REVIEW OF SYSTEMS:  Noncontributory.   PHYSICAL EXAMINATION:  GENERAL:  The patient is afebrile.  VITAL SIGNS:  Stable.  HEENT:  The patient is normocephalic.  Pupils equal, round, and reactive  to light and accommodation.  Extraocular movements were  intact.  Sclerae  are clear.  Oropharynx clear.  NECK:  No thyromegaly.  BREASTS:  Not examined.  LUNGS:  Clear.  CARDIOVASCULAR SYSTEM:  Regular rate without murmurs or gallops.  ABDOMEN:  Benign.  No mass, organomegaly, or tenderness.  PELVIC:  Moderate bleeding is noted.  Cervix unremarkable.  Uterus  normal size, shape and contour.  Adnexa free of masses or tenderness.  EXTREMITIES:  Trace edema.  NEUROLOGIC:  Grossly normal limits.   IMPRESSION:  1. Dysfunctional uterine bleeding.  2. Autoimmune hepatitis.  3. Thrombophilia with associated history of deep venous thrombosis,      presently on Coumadin.   PLAN:  The patient to undergo a D&C to try decrease the bleeding.  Risk  have been discussed including the risk of infection.  The risk of  vascular injury could lead to hemorrhage requiring transfusion with  associated risk of AIDS or hepatitis.  Excessive bleeding could require  hysterectomy.  This will obviously leave the patient sterile.  There is  a risk of uterine perforation leading to injury to adjacent organs,  could require further exploratory surgery.  Risk of deep venous  thrombosis and pulmonary embolus.  The patient does understands the  indications and risks.      Juluis Mire, M.D.  Electronically Signed     JSM/MEDQ  D:  12/03/2008  T:  12/04/2008  Job:  [id number]

## 2011-05-18 ENCOUNTER — Institutional Professional Consult (permissible substitution): Payer: 59 | Admitting: Pulmonary Disease

## 2011-05-18 NOTE — Discharge Summary (Signed)
NAME:  [name], Margaret [name]                         ACCOUNT NO.:  [id number]   MEDICAL RECORD NO.:  [id number]                   PATIENT TYPE:  INP   LOCATION:  0481                                 FACILITY:  [facility]   PHYSICIAN:  Rene Paci, M.D. [facility]          DATE OF BIRTH:  [birth year]   DATE OF ADMISSION:  07/17/2004  DATE OF DISCHARGE:  07/19/2004                                 DISCHARGE SUMMARY   DISCHARGE DIAGNOSES:  1. 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  2. 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 Randa Evens.  3. Dysfunctional uterine bleeding, continued progesterone in July and follow     up.  4. History of gastroesophageal reflux disease.   DISCHARGE MEDICATIONS:  1. Lovenox 130 mg subcutaneous q.12 hours, dispense 8.  Continuing coverage     pending on INR therapeutic range.  2. Coumadin 10 mg p.o. daily or as instructed by Dr. Drue Novel for a goal INR     between 2 and 3.  3. Other medications as prior to admission include Imuran, Prometrium and     Prilosec.  The patient is also given a prescription for Naprosyn 500 mg 1     q.8 hours p.r.n. menstrual cramps.  Possible follow-up is with her     primary care physician, Dr. Willow Ora, for Friday, July 22 at 2 p.m. for a     Coumadin recheck and to ensure adequate Lovenox coverage until INR     therapeutic.   DISPOSITION:  The patient is discharged home in medically stable and healthy  condition, understanding plans for treatment and follow-up.   HOSPITAL COURSE BY PROBLEM:  1. Recurrent DVT.  The patient is a pleasant 36 year old woman with history     of DVT in the right leg, 97, and twice in the left leg, 96, in 2000, who     presented to her primary care physician with increased right leg pain and     swelling.  Outpatient Dopplers confirmed DVT and she was admitted for IV  heparin and to begin Coumadin.  Urine pregnancy test was negative and the     patient was changed to Lovenox as she has been treated in this manner     before.  Pharmacy has selected 130 mg Lovenox twice daily to ensure     adequate coverage for her body weight, as well as initiation of Coumadin     10 mg daily to be titrated to an INR of 2-3 range.  We consider long-term     anticoagulation chronic in this patient with recurrent DVT.     Hypercoagulable was ordered and is still pending at the time of     discharge, outpatient follow-up with primary care physician.  2. Dysfunctional uterine bleeding.  The patient did have  menstrual bleeding     during her presentation.  Urine pregnancy was checked and negative, thus     making it safe to continue Coumadin.  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                                               Rene Paci, M.D. [facility]    VL/MEDQ  D:  07/19/2004  T:  07/19/2004  Job:  [id number]

## 2011-05-18 NOTE — Procedures (Signed)
. [facility]  Patient:    Margaret [name], Margaret [name]                        MRN: [id number] Proc. Date: 08/05/00 Adm. Date:  [id number] Attending:  Orland Mustard CC:         Genene Churn. Cyndie Chime, M.D.             Richard A. Jacky Kindle, M.D.             Judyann Munson, M.D. [address] [place].             [place], [place]. [address]                           Procedure Report  PROCEDURE:  Esophagogastroduodenoscopy.  MEDICATIONS: 1. Hurriciane spray. 2. Fentanyl 75 mcg. 3. Versed 2 mg intravenously.  INDICATIONS FOR PROCEDURE:  A nice young 36 year old with auto-immune hepatitis with signs of fairly significant liver disease, on Imuran and prednisolone.  She has had some increasing upper GI symptoms, started on Prilosec.  Due to these symptoms, the concern of upper GI process or the concern for presence of esophageal varices; these warranted endoscopy.  DESCRIPTION OF PROCEDURE:  The procedure had been explained to the patient and consent obtained.  The patient in the left lateral decubitus position, the Olympus video endoscope was inserted blindly with agglutination, advanced through the esophagus.  The stomach was entered, pylorus identified and passed.  Duodenum, including the bulb and second portion, were seen well and unremarkable.  The scope withdrawn back into the stomach.  The antrum and body were seen well and unremarkable.  No ulceration or inflammation.  Fundus and cardia were seen well.  No evidence of gastric varices.  The patient did have a widely patent GE junction.  The scope was withdrawn.  She had a 2-3 cm hiatal hernia with a somewhat patent GE junction.  No signs whatsoever of esophageal varices.  The esophagus was smooth with air distention.  There was no ulcerative esophagitis or any other particular lesions.  The scope was withdrawn.  The patient tolerated the procedure well.  She was maintained on low flow oxygen and pulse oximetry  throughout the procedure.  ASSESSMENT: 1. Hiatal hernia with no signs of complicated esophageal reflux. 2. No evidence of esophageal varices.  PLAN: 1. We will continue her on Prilosec. 2. See back in the office again in one month. DD:  08/05/00 TD:  08/05/00 Job: [id number]

## 2011-05-18 NOTE — H&P (Signed)
NAME:  Maultsby, Leata Salena Saner                         ACCOUNT NO.:  [id number]   MEDICAL RECORD NO.:  [id number]                   PATIENT TYPE:  INP   LOCATION:  0481                                 FACILITY:  [facility]   PHYSICIAN:  Wanda Plump, MD [facility]                 DATE OF BIRTH:  January 17, 1975   DATE OF ADMISSION:  07/17/2004  DATE OF DISCHARGE:                                HISTORY & PHYSICAL   CHIEF COMPLAINT:  Right leg swelling.   HISTORY OF PRESENT ILLNESS:  Ms. Vanderpool is a 36 year old [place] female who  presents to the office with a ten day history of right calf pain that has  extended to the posterior aspect of her right thigh in the last 24 hours.  She also has noticed some swelling at the calf.   PAST MEDICAL HISTORY:  1. Autoimmune hepatitis. She takes Imuran for that.  2. History of DVT in the right leg in 1997. She also had a DVT in the left     leg twice in 1996 and in 2000.  3. History of irregular periods.  4. History of GERD.   FAMILY HISTORY:  The patient is adopted. Her biological mother was an  alcoholic. No more details in regards to her family history.   SOCIAL HISTORY:  Never smoked and drinks very seldom.   REVIEW OF SYSTEMS:  She denies any fever, chest pain, shortness of breath.  She was treated for sinusitis earlier this month and she is still coughing  some. Denies any hemoptysis. She also had some nausea and diarrhea since she  was prescribed antibiotics earlier this month.   MEDICATIONS:  1. Multivitamins.  2. Aspirin 81 mg once a day.  3. Prometrium 200 mg. She takes one tablet from day #1 to day #12 of her     period.  4. Imuran. She believes she takes 250 mg a day, but she is not sure.  5. Prilosec 20 one p.o. daily.   ALLERGIES:  No known drug allergies.   PHYSICAL EXAMINATION:  GENERAL: The patient is alert, oriented, well-  developed, in no distress.  VITAL SIGNS: She weighs 270 pounds, pulse 60, respirations 16, blood  pressure 120/70.  LUNGS: Clear to auscultation bilaterally.  CARDIOVASCULAR: Regular rate and rhythm without murmur.  EXTREMITIES: She has good pedal pulses bilaterally and no peripheral edema.  The left calf measures 50 cm and the right calf measures 48 cm. The patient  states that the left calf is chronically enlarged, but the right calf looks  bigger since all these problems started. She does have some right calf pain  with dorsal flexion of the right foot.   LABORATORY AND X-RAYS:  The patient was sent to [facility]. They did find DVT  which extends up to the knee. She also has superficial thrombophlebitis of  the right lesser saphenous vein.   ASSESSMENT/PLAN:  Ms. Ammirati  presents to the office with right calf swelling  and pain. Her symptoms were consistent with a deep venous thrombosis. The  ultrasound confirmed that suspicious. Even though the deep venous thrombosis  does not go beyond the knee and because of her past medical history, I will  treat her with full anticoagulation. She will be admitted to the hospital  for heparin and Coumadinization. Because of the recurrence of these problems  we will go ahead and check a hypercoagulability panel before she starts the  heparin.                                               Wanda Plump, MD [facility]    JEP/MEDQ  D:  07/17/2004  T:  07/18/2004  Job:  [id number]

## 2011-05-25 ENCOUNTER — Ambulatory Visit: Payer: 59 | Admitting: *Deleted

## 2011-05-29 ENCOUNTER — Other Ambulatory Visit: Payer: Self-pay | Admitting: *Deleted

## 2011-05-29 MED ORDER — OXYCODONE HCL 5 MG PO CAPS: 5.0000 mg | ORAL_CAPSULE | Freq: Two times a day (BID) | ORAL | Status: DC | PRN

## 2011-05-29 NOTE — Telephone Encounter (Signed)
60, no RF 

## 2011-06-11 ENCOUNTER — Ambulatory Visit (INDEPENDENT_AMBULATORY_CARE_PROVIDER_SITE_OTHER): Payer: 59 | Admitting: *Deleted

## 2011-06-11 DIAGNOSIS — I82409 Acute embolism and thrombosis of unspecified deep veins of unspecified lower extremity: Secondary | ICD-10-CM

## 2011-06-27 ENCOUNTER — Other Ambulatory Visit: Payer: Self-pay | Admitting: *Deleted

## 2011-06-27 MED ORDER — OXYCODONE HCL 5 MG PO CAPS: 5.0000 mg | ORAL_CAPSULE | Freq: Two times a day (BID) | ORAL | Status: DC | PRN

## 2011-06-27 NOTE — Telephone Encounter (Signed)
60, no RF 

## 2011-07-06 ENCOUNTER — Encounter: Payer: Self-pay | Admitting: Internal Medicine

## 2011-07-06 ENCOUNTER — Ambulatory Visit (INDEPENDENT_AMBULATORY_CARE_PROVIDER_SITE_OTHER): Payer: 59 | Admitting: Internal Medicine

## 2011-07-06 DIAGNOSIS — Z0000 Encounter for general adult medical examination without abnormal findings: Secondary | ICD-10-CM | POA: Insufficient documentation

## 2011-07-06 DIAGNOSIS — K754 Autoimmune hepatitis: Secondary | ICD-10-CM | POA: Insufficient documentation

## 2011-07-06 DIAGNOSIS — Z23 Encounter for immunization: Secondary | ICD-10-CM

## 2011-07-06 NOTE — Progress Notes (Signed)
  Subjective:    Patient ID: Margaret [name], female    DOB: 07-May-1975, 36 y.o.   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  Past Medical History  Diagnosis Date  . DVT (deep venous thrombosis) 1996, 2000    h/o  . GERD (gastroesophageal reflux disease)   . Obesity, morbid   . Anticardiolipin antibody syndrome   . Hepatitis, autoimmune   . Abnormal menses   . Sleep apnea      mild to moderate  per apnea link y 3-12    Past Surgical History  Procedure Date  . Dilation and curettage of uterus 11/2008    Family History  Problem Relation Age of Onset  . Adopted: Yes    History   Social History  . Marital Status: Married    Spouse Name: N/A    Number of Children: 1  . 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 Smoking status: Current Some Day Smoker -- 0.1 packs/day    Last Attempt to Quit: 11/30/2009  . Smokeless tobacco: Never Used  . Alcohol Use: Yes     occasionally   . Drug Use: No  . Sexually Active: Not on file   Other Topics Concern  . Not on file   Social History Narrative   Diet: unchanged, has lost some wt ----Exercise : more active than before      Review of Systems No chest pain or shortness of breath No nausea, vomiting, diarrhea. No blood in the stools. No vaginal bleeding etc. for her periods No anxiety or depression, She saw a mild amount of blood from the belly bottom, her gynecologist prescribed a cream, symptoms have resurfaced. She plans to talk to them about it.        Objective:   Physical Exam  Constitutional: She is oriented to person, place, and time. She appears well-developed.       Morbidly obese  HENT:  Head: Normocephalic and atraumatic.  Neck: No thyromegaly present.       Normal carotid pulse  Cardiovascular: Normal rate, regular rhythm and normal  heart sounds.   No murmur heard. Pulmonary/Chest: Effort normal and breath sounds normal. No respiratory distress. She has no wheezes. She has no rales.  Abdominal: Soft. She exhibits no distension. There is no tenderness. There is no rebound.  Musculoskeletal: She exhibits no edema.  Neurological: She is alert and oriented to person, place, and time.  Skin: Skin is warm and dry.  Psychiatric: She has a normal mood and affect. Her behavior is normal. Judgment and thought content normal.          Assessment & Plan:

## 2011-07-06 NOTE — Assessment & Plan Note (Signed)
Reports she saw Dr. Randa Evens last week, LFTs were checked.

## 2011-07-06 NOTE — Assessment & Plan Note (Addendum)
Td today Reports a remote Cscope by Dr Randa Evens, GI Was recently found to have a low vitamin D, status post ergocalciferol. Labs: A1c, TSH, FLP, vitamin D. Extensive discussion about diet and exercise, she reports that her diet is not too bad. I thought about bariatric surgery but due to other comorbidities and chronic Coumadin that is riskier than in  the average patient. Sees gyn We are filling her foster parent form noting that she takes  Blood thinners and is ok  doing  ADLs but not heavy physical work.

## 2011-07-06 NOTE — Patient Instructions (Signed)
Please come back fasting: FLP, TSH, vitamin D--- dx v70 Hemoglobin A1c---dx  hyperglycemia

## 2011-07-16 ENCOUNTER — Encounter: Payer: 59 | Admitting: *Deleted

## 2011-07-18 ENCOUNTER — Ambulatory Visit (INDEPENDENT_AMBULATORY_CARE_PROVIDER_SITE_OTHER): Payer: 59 | Admitting: *Deleted

## 2011-07-18 ENCOUNTER — Other Ambulatory Visit: Payer: 59

## 2011-07-18 DIAGNOSIS — R739 Hyperglycemia, unspecified: Secondary | ICD-10-CM

## 2011-07-18 DIAGNOSIS — Z0000 Encounter for general adult medical examination without abnormal findings: Secondary | ICD-10-CM

## 2011-07-18 DIAGNOSIS — Z111 Encounter for screening for respiratory tuberculosis: Secondary | ICD-10-CM

## 2011-07-18 DIAGNOSIS — R7309 Other abnormal glucose: Secondary | ICD-10-CM

## 2011-07-18 LAB — LIPID PANEL
Cholesterol: 175 mg/dL (ref 0–200)
HDL: 36.5 mg/dL — ABNORMAL LOW (ref >39.00)
LDL Cholesterol: 110 mg/dL — ABNORMAL HIGH (ref 0–99)
Total CHOL/HDL Ratio: 5
Triglycerides: 144 mg/dL (ref 0.0–149.0)

## 2011-07-20 ENCOUNTER — Ambulatory Visit (INDEPENDENT_AMBULATORY_CARE_PROVIDER_SITE_OTHER): Payer: 59 | Admitting: *Deleted

## 2011-07-20 ENCOUNTER — Encounter: Payer: 59 | Admitting: *Deleted

## 2011-07-20 DIAGNOSIS — I82409 Acute embolism and thrombosis of unspecified deep veins of unspecified lower extremity: Secondary | ICD-10-CM

## 2011-07-20 LAB — POCT INR: INR: 2

## 2011-07-21 LAB — VITAMIN D 1,25 DIHYDROXY
Vitamin D2 1, 25 (OH)2: 9 pg/mL
Vitamin D3 1, 25 (OH)2: 32 pg/mL

## 2011-07-31 ENCOUNTER — Other Ambulatory Visit: Payer: Self-pay | Admitting: *Deleted

## 2011-07-31 MED ORDER — OXYCODONE HCL 5 MG PO CAPS: 5.0000 mg | ORAL_CAPSULE | Freq: Two times a day (BID) | ORAL | Status: DC | PRN

## 2011-07-31 NOTE — Telephone Encounter (Signed)
Per Dr.Hopper fill for 1 month.

## 2011-07-31 NOTE — Telephone Encounter (Signed)
Last ov- 07/06/11 Last filled- 06/27/11.

## 2011-08-17 ENCOUNTER — Encounter: Payer: 59 | Admitting: *Deleted

## 2011-08-17 ENCOUNTER — Other Ambulatory Visit: Payer: Self-pay

## 2011-08-17 MED ORDER — WARFARIN SODIUM 5 MG PO TABS: ORAL_TABLET | ORAL | Status: DC

## 2011-08-21 ENCOUNTER — Ambulatory Visit (INDEPENDENT_AMBULATORY_CARE_PROVIDER_SITE_OTHER): Payer: 59 | Admitting: *Deleted

## 2011-08-21 DIAGNOSIS — I82409 Acute embolism and thrombosis of unspecified deep veins of unspecified lower extremity: Secondary | ICD-10-CM

## 2011-08-21 LAB — POCT INR: INR: 3

## 2011-08-30 ENCOUNTER — Telehealth: Payer: Self-pay | Admitting: *Deleted

## 2011-08-30 MED ORDER — OXYCODONE HCL 5 MG PO CAPS: 5.0000 mg | ORAL_CAPSULE | Freq: Two times a day (BID) | ORAL | Status: DC | PRN

## 2011-08-30 NOTE — Telephone Encounter (Signed)
Ok 60, no RF 

## 2011-08-30 NOTE — Telephone Encounter (Signed)
  Rx printed awaiting MD signature 

## 2011-08-30 NOTE — Telephone Encounter (Signed)
Last filled on 7.31.12 and last office visit 7.6.12

## 2011-08-31 ENCOUNTER — Telehealth: Payer: Self-pay | Admitting: *Deleted

## 2011-08-31 NOTE — Telephone Encounter (Signed)
Pt informed Rx ready for p/u before 5pm today-reminded closed Monday for holiday.

## 2011-09-18 ENCOUNTER — Ambulatory Visit (INDEPENDENT_AMBULATORY_CARE_PROVIDER_SITE_OTHER): Payer: 59 | Admitting: *Deleted

## 2011-09-18 DIAGNOSIS — I82409 Acute embolism and thrombosis of unspecified deep veins of unspecified lower extremity: Secondary | ICD-10-CM

## 2011-09-19 LAB — POCT INR: INR: 2.7

## 2011-10-02 ENCOUNTER — Other Ambulatory Visit: Payer: Self-pay | Admitting: Internal Medicine

## 2011-10-02 MED ORDER — OXYCODONE HCL 5 MG PO CAPS: 5.0000 mg | ORAL_CAPSULE | Freq: Two times a day (BID) | ORAL | Status: DC | PRN

## 2011-10-02 NOTE — Telephone Encounter (Signed)
Oxycodone request [last refill 08/30/11 #60x0]

## 2011-10-02 NOTE — Telephone Encounter (Signed)
Rx printed awaiting signature. 

## 2011-10-02 NOTE — Telephone Encounter (Signed)
Ok 60, no Rf 

## 2011-10-02 NOTE — Telephone Encounter (Signed)
Patient called this morning wanting controlled medication prescription by 2:00pm today--this was the issue when calling back this afternoon, as patient was told she could pick up Rx tomorrow [24-48 hr turn around on med request].

## 2011-10-02 NOTE — Telephone Encounter (Signed)
Ready for p/u

## 2011-10-03 LAB — CBC
MCV: 93.2
Platelets: 242
WBC: 7.4

## 2011-10-03 LAB — HCG, SERUM, QUALITATIVE: Preg, Serum: NEGATIVE

## 2011-10-03 LAB — FIBRINOGEN: Fibrinogen: 374

## 2011-10-03 LAB — APTT: aPTT: 39 — ABNORMAL HIGH

## 2011-10-03 LAB — PROTIME-INR
INR: 2.4 — ABNORMAL HIGH
Prothrombin Time: 28.1 — ABNORMAL HIGH

## 2011-10-05 LAB — CBC
HCT: 32.9 % — ABNORMAL LOW (ref 36.0–46.0)
Platelets: 257 10*3/uL (ref 150–400)
RDW: 15 % (ref 11.5–15.5)
WBC: 6.2 10*3/uL (ref 4.0–10.5)

## 2011-10-05 LAB — COMPREHENSIVE METABOLIC PANEL
ALT: 46 U/L — ABNORMAL HIGH (ref 0–35)
AST: 52 U/L — ABNORMAL HIGH (ref 0–37)
Albumin: 2.6 g/dL — ABNORMAL LOW (ref 3.5–5.2)
Alkaline Phosphatase: 49 U/L (ref 39–117)
Calcium: 8.3 mg/dL — ABNORMAL LOW (ref 8.4–10.5)
GFR calc Af Amer: >60 mL/min (ref >60)
Glucose, Bld: 105 mg/dL — ABNORMAL HIGH (ref 70–99)
Potassium: 4.1 mEq/L (ref 3.5–5.1)
Sodium: 137 mEq/L (ref 135–145)
Total Protein: 8.1 g/dL (ref 6.0–8.3)

## 2011-10-05 LAB — HCG, SERUM, QUALITATIVE: Preg, Serum: NEGATIVE

## 2011-10-16 ENCOUNTER — Ambulatory Visit (INDEPENDENT_AMBULATORY_CARE_PROVIDER_SITE_OTHER): Payer: 59 | Admitting: *Deleted

## 2011-10-16 DIAGNOSIS — I82409 Acute embolism and thrombosis of unspecified deep veins of unspecified lower extremity: Secondary | ICD-10-CM

## 2011-10-16 LAB — POCT INR: INR: 2.5

## 2011-11-06 ENCOUNTER — Other Ambulatory Visit: Payer: Self-pay | Admitting: Internal Medicine

## 2011-11-06 MED ORDER — OXYCODONE HCL 5 MG PO CAPS: 5.0000 mg | ORAL_CAPSULE | Freq: Two times a day (BID) | ORAL | Status: DC | PRN

## 2011-11-06 NOTE — Telephone Encounter (Signed)
Spoke with pt and she is now aware that there is a 48 hour turn around period for Rx. Advised pt that the Rx will be ready for her to pick up by 2:30pm 11/07/11. Pt is fine with that stating that she will take tylenol for her pain tonight

## 2011-11-06 NOTE — Telephone Encounter (Signed)
Rx printed and ready for pick up.  

## 2011-11-06 NOTE — Telephone Encounter (Signed)
Ok 60, no RF 

## 2011-11-06 NOTE — Telephone Encounter (Signed)
Last OV 07/06/11. Last filled 10/02/11

## 2011-11-26 ENCOUNTER — Ambulatory Visit (INDEPENDENT_AMBULATORY_CARE_PROVIDER_SITE_OTHER): Payer: 59 | Admitting: *Deleted

## 2011-11-26 DIAGNOSIS — I82409 Acute embolism and thrombosis of unspecified deep veins of unspecified lower extremity: Secondary | ICD-10-CM

## 2011-11-26 DIAGNOSIS — Z7901 Long term (current) use of anticoagulants: Secondary | ICD-10-CM

## 2011-12-03 ENCOUNTER — Other Ambulatory Visit: Payer: Self-pay | Admitting: *Deleted

## 2011-12-03 MED ORDER — OXYCODONE HCL 5 MG PO CAPS: 5.0000 mg | ORAL_CAPSULE | Freq: Two times a day (BID) | ORAL | Status: DC | PRN

## 2011-12-03 NOTE — Telephone Encounter (Signed)
Rx printed and sent to provider for signature.  Rx signed and left at front desk for patient pick up.  Call placed to patient at [phone] answer. A detailed voice message was left informing patient. Rx left at front desk for patient pick up.

## 2011-12-03 NOTE — Telephone Encounter (Signed)
Ok 60, no RF 

## 2011-12-03 NOTE — Telephone Encounter (Signed)
Patient called and left voice message requesting a refill on Oxycodone for her back pain. She would like to stop by the office and pick up the Rx. Last office visit 07/2011. Last refill on oxycodone was 11/06/2011. Please advise.

## 2011-12-24 ENCOUNTER — Other Ambulatory Visit: Payer: Self-pay | Admitting: Internal Medicine

## 2011-12-27 ENCOUNTER — Telehealth: Payer: Self-pay

## 2011-12-27 ENCOUNTER — Ambulatory Visit (INDEPENDENT_AMBULATORY_CARE_PROVIDER_SITE_OTHER): Payer: 59 | Admitting: Internal Medicine

## 2011-12-27 ENCOUNTER — Encounter: Payer: Self-pay | Admitting: Internal Medicine

## 2011-12-27 VITALS — BP 118/84 | HR 93 | Temp 98.7°F | Wt 277.2 lb

## 2011-12-27 DIAGNOSIS — M549 Dorsalgia, unspecified: Secondary | ICD-10-CM

## 2011-12-27 DIAGNOSIS — J329 Chronic sinusitis, unspecified: Secondary | ICD-10-CM

## 2011-12-27 MED ORDER — OXYCODONE HCL 5 MG PO CAPS: 5.0000 mg | ORAL_CAPSULE | Freq: Two times a day (BID) | ORAL | Status: DC | PRN

## 2011-12-27 MED ORDER — CEFUROXIME AXETIL 500 MG PO TABS: 500.0000 mg | ORAL_TABLET | Freq: Two times a day (BID) | ORAL | Status: AC

## 2011-12-27 NOTE — Progress Notes (Signed)
  Subjective:    Patient ID: Margaret [name], female    DOB: [birth year], 36 y.o.   MRN: [id number]  HPI Acute visit. Cough for 3 weeks, having nasal discharge and sputum production as well, initially mucus was clear but now is green.  Past Medical History  Diagnosis Date  . DVT (deep venous thrombosis) 1996, 2000    h/o  . GERD (gastroesophageal reflux disease)   . Obesity, morbid   . Anticardiolipin antibody syndrome   . Hepatitis, autoimmune   . Abnormal menses   . Sleep apnea      mild to moderate  per apnea link y 3-12      Review of Systems No fever or chills, eyes are sometimes watery. Having also daily, frontal headaches. Occasionally he hasn't vomited usually when she starts coughing. Some anterior chest pain with cough, mild shortness of breath      Objective:   Physical Exam  Constitutional: She appears well-developed. No distress.       No acute distress, nontoxic  HENT:       Face symmetric, slightly tender at the frontal sinuses bilaterally. Nose is quite congested. Throat is not red, no discharge  Cardiovascular: Normal rate, regular rhythm and normal heart sounds.   No murmur heard. Pulmonary/Chest:       Few rhonchi with cough, no wheezing or increased  work of breathing  Skin: She is not diaphoretic.       Assessment & Plan:  Bronchitis/sinusitis: Respiratory symptoms likely from bronchitis- sinusitis, symptoms going on for 3 weeks, will start Ceftin. See instructions. Also recommend saline irrigation in her nose.

## 2011-12-27 NOTE — Assessment & Plan Note (Signed)
Request a refill on pain medication --> done

## 2011-12-27 NOTE — Patient Instructions (Addendum)
Rest, fluids , tylenol For cough, take Mucinex DM twice a day as needed  Take the antibiotic as prescribed ---->ceftin Call if no better in few days Call anytime if the symptoms are severe

## 2011-12-27 NOTE — Telephone Encounter (Signed)
Call from patient and she has been sick for 3 weeks with cough with discolored phlegm and drainage. She stated she has tried Mucinex, Robitussin DM with no relief. Denied fever , body aches. Apt scheduled    KP

## 2011-12-28 ENCOUNTER — Encounter: Payer: Self-pay | Admitting: Internal Medicine

## 2012-01-07 ENCOUNTER — Ambulatory Visit (INDEPENDENT_AMBULATORY_CARE_PROVIDER_SITE_OTHER): Payer: 59 | Admitting: *Deleted

## 2012-01-07 DIAGNOSIS — Z7901 Long term (current) use of anticoagulants: Secondary | ICD-10-CM

## 2012-01-07 DIAGNOSIS — I82409 Acute embolism and thrombosis of unspecified deep veins of unspecified lower extremity: Secondary | ICD-10-CM

## 2012-01-28 ENCOUNTER — Other Ambulatory Visit: Payer: Self-pay | Admitting: *Deleted

## 2012-01-28 NOTE — Telephone Encounter (Signed)
Refill request Oxycodone 5mg . #60 with zero refills. Last refilled on 12.27.13. OK to refill?

## 2012-01-28 NOTE — Telephone Encounter (Signed)
Yes please

## 2012-01-29 MED ORDER — OXYCODONE HCL 5 MG PO CAPS: 5.0000 mg | ORAL_CAPSULE | Freq: Two times a day (BID) | ORAL | Status: DC | PRN

## 2012-01-29 NOTE — Telephone Encounter (Signed)
Refill done.  

## 2012-02-20 ENCOUNTER — Ambulatory Visit (INDEPENDENT_AMBULATORY_CARE_PROVIDER_SITE_OTHER): Payer: 59 | Admitting: *Deleted

## 2012-02-20 DIAGNOSIS — Z7901 Long term (current) use of anticoagulants: Secondary | ICD-10-CM

## 2012-02-20 DIAGNOSIS — I82409 Acute embolism and thrombosis of unspecified deep veins of unspecified lower extremity: Secondary | ICD-10-CM

## 2012-02-20 LAB — POCT INR: INR: 3.2

## 2012-02-22 ENCOUNTER — Other Ambulatory Visit: Payer: Self-pay | Admitting: *Deleted

## 2012-02-22 MED ORDER — OXYCODONE HCL 5 MG PO CAPS: 5.0000 mg | ORAL_CAPSULE | Freq: Two times a day (BID) | ORAL | Status: DC | PRN

## 2012-02-22 NOTE — Telephone Encounter (Signed)
Ok 60, no RF 

## 2012-02-22 NOTE — Telephone Encounter (Signed)
Refill done.  

## 2012-02-22 NOTE — Telephone Encounter (Signed)
Refill request oxycodone #60 with zero refills. Last refilled on 1.29.13. OK to refill?

## 2012-03-24 ENCOUNTER — Other Ambulatory Visit: Payer: Self-pay | Admitting: Internal Medicine

## 2012-03-24 MED ORDER — OXYCODONE HCL 5 MG PO CAPS: 5.0000 mg | ORAL_CAPSULE | Freq: Two times a day (BID) | ORAL | Status: DC | PRN

## 2012-03-24 NOTE — Telephone Encounter (Signed)
Patient would like to pick up a prescription for   OxyCODONE HCl (Cap) OXY-IR 5 MG  Take 1 capsule (5 mg total) by mouth 2 (two) times daily as needed. Call when ready [phone]

## 2012-03-24 NOTE — Telephone Encounter (Signed)
Refill done.  

## 2012-03-24 NOTE — Telephone Encounter (Signed)
OK to refill? #60 with zero refills. Last refilled on 2.22.13.

## 2012-03-24 NOTE — Telephone Encounter (Signed)
Ok 60, no RF 

## 2012-03-27 ENCOUNTER — Ambulatory Visit (INDEPENDENT_AMBULATORY_CARE_PROVIDER_SITE_OTHER): Payer: 59

## 2012-03-27 DIAGNOSIS — I82409 Acute embolism and thrombosis of unspecified deep veins of unspecified lower extremity: Secondary | ICD-10-CM

## 2012-03-27 DIAGNOSIS — Z7901 Long term (current) use of anticoagulants: Secondary | ICD-10-CM

## 2012-03-27 LAB — POCT INR: INR: 3.9

## 2012-04-11 ENCOUNTER — Ambulatory Visit (INDEPENDENT_AMBULATORY_CARE_PROVIDER_SITE_OTHER): Payer: 59

## 2012-04-11 DIAGNOSIS — I82409 Acute embolism and thrombosis of unspecified deep veins of unspecified lower extremity: Secondary | ICD-10-CM

## 2012-04-11 DIAGNOSIS — Z7901 Long term (current) use of anticoagulants: Secondary | ICD-10-CM

## 2012-04-11 LAB — POCT INR: INR: 2.5

## 2012-04-25 ENCOUNTER — Other Ambulatory Visit: Payer: Self-pay | Admitting: *Deleted

## 2012-04-25 MED ORDER — OXYCODONE HCL 5 MG PO CAPS: 5.0000 mg | ORAL_CAPSULE | Freq: Two times a day (BID) | ORAL | Status: DC | PRN

## 2012-04-25 NOTE — Telephone Encounter (Signed)
60, no RF 

## 2012-04-25 NOTE — Telephone Encounter (Signed)
Refill request oxycodone 5mg  #60 with zero refills. Last refilled on 3.25.13 OK to refill?

## 2012-04-25 NOTE — Telephone Encounter (Signed)
Refill done.  

## 2012-05-09 ENCOUNTER — Ambulatory Visit (INDEPENDENT_AMBULATORY_CARE_PROVIDER_SITE_OTHER): Payer: 59 | Admitting: *Deleted

## 2012-05-09 DIAGNOSIS — I82409 Acute embolism and thrombosis of unspecified deep veins of unspecified lower extremity: Secondary | ICD-10-CM

## 2012-05-09 DIAGNOSIS — Z7901 Long term (current) use of anticoagulants: Secondary | ICD-10-CM

## 2012-05-27 ENCOUNTER — Telehealth: Payer: Self-pay | Admitting: *Deleted

## 2012-05-27 ENCOUNTER — Other Ambulatory Visit: Payer: Self-pay | Admitting: Internal Medicine

## 2012-05-27 MED ORDER — OXYCODONE HCL 5 MG PO CAPS: 5.0000 mg | ORAL_CAPSULE | Freq: Two times a day (BID) | ORAL | Status: DC | PRN

## 2012-05-27 NOTE — Telephone Encounter (Signed)
60, 0 RF 

## 2012-05-27 NOTE — Telephone Encounter (Signed)
Refill request for oxycodone 5mg  #60 with 0 refills. Last filled on 4.26.13. OK to refill?

## 2012-05-27 NOTE — Telephone Encounter (Signed)
Refill done.  

## 2012-06-06 ENCOUNTER — Ambulatory Visit (INDEPENDENT_AMBULATORY_CARE_PROVIDER_SITE_OTHER): Payer: 59 | Admitting: Pharmacist

## 2012-06-06 DIAGNOSIS — Z7901 Long term (current) use of anticoagulants: Secondary | ICD-10-CM

## 2012-06-06 DIAGNOSIS — I82409 Acute embolism and thrombosis of unspecified deep veins of unspecified lower extremity: Secondary | ICD-10-CM

## 2012-06-06 LAB — POCT INR: INR: 2.6

## 2012-06-26 ENCOUNTER — Other Ambulatory Visit: Payer: Self-pay | Admitting: *Deleted

## 2012-06-26 MED ORDER — OXYCODONE HCL 5 MG PO CAPS: 5.0000 mg | ORAL_CAPSULE | Freq: Two times a day (BID) | ORAL | Status: DC | PRN

## 2012-06-26 NOTE — Telephone Encounter (Signed)
Refill request for oxycodone 5mg  #60 with zero refills. Last filled on 5.28.13. OK to refill?

## 2012-06-26 NOTE — Telephone Encounter (Signed)
Refill done.  

## 2012-06-26 NOTE — Telephone Encounter (Signed)
yes

## 2012-07-18 ENCOUNTER — Ambulatory Visit (INDEPENDENT_AMBULATORY_CARE_PROVIDER_SITE_OTHER): Payer: 59 | Admitting: *Deleted

## 2012-07-18 DIAGNOSIS — Z7901 Long term (current) use of anticoagulants: Secondary | ICD-10-CM

## 2012-07-18 DIAGNOSIS — I82409 Acute embolism and thrombosis of unspecified deep veins of unspecified lower extremity: Secondary | ICD-10-CM

## 2012-07-18 LAB — POCT INR: INR: 3.1

## 2012-07-30 ENCOUNTER — Telehealth: Payer: Self-pay | Admitting: Internal Medicine

## 2012-07-30 MED ORDER — OXYCODONE HCL 5 MG PO CAPS: 5.0000 mg | ORAL_CAPSULE | Freq: Two times a day (BID) | ORAL | Status: DC | PRN

## 2012-07-30 NOTE — Telephone Encounter (Signed)
Ok to refill 

## 2012-07-30 NOTE — Telephone Encounter (Signed)
Okay for #60, no refills. Advise patient, she is due for a checkup

## 2012-07-30 NOTE — Telephone Encounter (Signed)
Pt called requesting rx for oxycodone. Call [phone] when ready.

## 2012-07-30 NOTE — Telephone Encounter (Signed)
Refill done, pt notified rx is ready to be picked up. Told pt she needs to schedule an OV for future refills. She understood & she said she will call back to make the appt.

## 2012-08-29 ENCOUNTER — Ambulatory Visit (INDEPENDENT_AMBULATORY_CARE_PROVIDER_SITE_OTHER): Payer: 59 | Admitting: Pharmacist

## 2012-08-29 ENCOUNTER — Other Ambulatory Visit: Payer: Self-pay | Admitting: Internal Medicine

## 2012-08-29 ENCOUNTER — Ambulatory Visit (INDEPENDENT_AMBULATORY_CARE_PROVIDER_SITE_OTHER): Payer: 59 | Admitting: Internal Medicine

## 2012-08-29 VITALS — BP 128/84 | HR 114 | Temp 98.0°F | Wt 269.0 lb

## 2012-08-29 DIAGNOSIS — G4730 Sleep apnea, unspecified: Secondary | ICD-10-CM

## 2012-08-29 DIAGNOSIS — D649 Anemia, unspecified: Secondary | ICD-10-CM

## 2012-08-29 DIAGNOSIS — R5381 Other malaise: Secondary | ICD-10-CM

## 2012-08-29 DIAGNOSIS — F329 Major depressive disorder, single episode, unspecified: Secondary | ICD-10-CM

## 2012-08-29 DIAGNOSIS — I82409 Acute embolism and thrombosis of unspecified deep veins of unspecified lower extremity: Secondary | ICD-10-CM

## 2012-08-29 DIAGNOSIS — Z7901 Long term (current) use of anticoagulants: Secondary | ICD-10-CM

## 2012-08-29 DIAGNOSIS — M549 Dorsalgia, unspecified: Secondary | ICD-10-CM

## 2012-08-29 DIAGNOSIS — R000 Tachycardia, unspecified: Secondary | ICD-10-CM

## 2012-08-29 MED ORDER — OXYCODONE HCL 5 MG PO CAPS: 5.0000 mg | ORAL_CAPSULE | Freq: Two times a day (BID) | ORAL | Status: DC | PRN

## 2012-08-29 NOTE — Assessment & Plan Note (Signed)
Refer to pulmonary for treatment. She was noted to be tachycardic, EKGs show sinus rhythm, rate 100, no acute changes

## 2012-08-29 NOTE — Assessment & Plan Note (Addendum)
Continue with generalized fatigue. Most likely multifactorial including obesity, sleep apnea, liver disease, depression ( a new dx) Plan: TSH Refer to Dr. Vassie Loll for treatment of sleep apnea with CPAP

## 2012-08-29 NOTE — Progress Notes (Signed)
  Subjective:    Patient ID: Margaret [name], female    DOB: 1975/09/15, 37 y.o.   MRN: [id number]  HPI Routine office visit, we discussed the following issues Back pain, on oxycodone, make her drowsy so she only takes it at night, continue with back pain during the daytime. Continue with of fatigue, described as simply lack of energy. She sees GI for liver issues, her last LFTs were elevated, she is to see GI again soon. History of sleep apnea and morbid obesity, has lost 10 pounds since December, she is still significantly overweight  Past Medical History: DVT--1996, 200 GERD OBESITY, MORBID  ANTICARDIOLIPIN ANTIBODY SYNDROME  HEPATITIS AUTOINMUNE --NAFLD Abnormal menses Sleep apnea her apnea lynk 03-2011  Past Surgical History: D&C approx 11/2008  Social History: Married one child quit tobacco in December 2010 not working at present    Review of Systems When asked about depression she said "I'm not sure", denies anxiety or suicidal ideas. No chest pain, shortness of breath, orthopnea. No lower extremity edema. Labs from GI reviewed: Few days ago hemoglobin was 13.4, sodium 132, potassium 3.9, AST 81, ALT 65.     Objective:   Physical Exam  General -- alert, well-developed, and overweight appearing. No apparent distress.  Lungs -- normal respiratory effort, no intercostal retractions, no accessory muscle use, and normal breath sounds.   Heart-- tachycardic, no murmur, and no gallop.   Extremities-- no pretibial edema bilaterally  Neurologic-- alert & oriented X3 and strength normal in all extremities. Psych-- Cognition and judgment appear intact. Alert and cooperative with normal attention span and concentration.  not anxious appearing and not depressed appearing.       Assessment & Plan:

## 2012-08-29 NOTE — Telephone Encounter (Signed)
Refill done.  

## 2012-08-29 NOTE — Assessment & Plan Note (Addendum)
I suspected the patient was depressed, she wasn't sure, we did a PHQ- 9 and she scored 19 which is moderate to severe depression. Plan: Refer to a counselor, meds  ADDENDUM UTD reviewed, Lexapro 10 mg is okay in liver impairment. Will call a prescription and let pt know,

## 2012-08-29 NOTE — Assessment & Plan Note (Signed)
See review of systems, recent hemoglobin normal

## 2012-08-29 NOTE — Assessment & Plan Note (Signed)
Ongoing issue, well-controlled at night with oxycodone however declines to take oxycodone in the daytime because she gets a sleepy. Due to liver disease she needs to stay clear from Tylenol. Was seen by [place] [facility]  in 2010. Plan: Refer to pain management, local ejection?

## 2012-09-03 MED ORDER — ESCITALOPRAM OXALATE 10 MG PO TABS: 10.0000 mg | ORAL_TABLET | Freq: Every day | ORAL | Status: DC

## 2012-09-16 ENCOUNTER — Institutional Professional Consult (permissible substitution): Payer: 59 | Admitting: Pulmonary Disease

## 2012-09-22 ENCOUNTER — Other Ambulatory Visit: Payer: Self-pay | Admitting: Gastroenterology

## 2012-09-22 DIAGNOSIS — K754 Autoimmune hepatitis: Secondary | ICD-10-CM

## 2012-09-23 ENCOUNTER — Ambulatory Visit (INDEPENDENT_AMBULATORY_CARE_PROVIDER_SITE_OTHER): Payer: 59 | Admitting: Pulmonary Disease

## 2012-09-23 ENCOUNTER — Encounter: Payer: Self-pay | Admitting: Pulmonary Disease

## 2012-09-23 VITALS — BP 132/90 | HR 77 | Temp 98.0°F | Ht 59.0 in | Wt 268.0 lb

## 2012-09-23 DIAGNOSIS — G4730 Sleep apnea, unspecified: Secondary | ICD-10-CM

## 2012-09-23 NOTE — Progress Notes (Signed)
Subjective:    Patient ID: Margaret [name], female    DOB: Mar 15, 1975, 37 y.o.   MRN: [id number]  HPI 37 y.o smoker for evaluation of obstructive sleep apnea  Home study 3/12 (apnealink) showed RDI 15/h, ODI 18/h, 30 min desatn < 88% She reports excessive daytime somnolence & tiredness & on refreshing sleep. She also has difficulty initiating sleep. She takes oxy-IR for chronic back pain & imuran for autoimmune hepatitis, prednisone is being restarted Randa Evens). She has just been started on lexapro for depression.  ESS 18/24 Bedtime is 10-11 pm, latency 1-3h, sleeps on her side x 1 pillow, 2-3 awakenings including 1 BR visit, no post void latency, oob at 7a feeling tired with occasional headache & dryness of mouth. She drinks alcohol on weekends & denies excessive use of caffeinated beverages. She smokes 3 cigs daily. She had wt gain with steroids in the past & haas gained about 10 lbs since the home study. She works as a Investment banker, corporate & asst to a realtor & has a 37 y.o There is no history suggestive of cataplexy, sleep paralysis or parasomnias   Past Medical History  Diagnosis Date  . DVT (deep venous thrombosis) 1996, 2000    h/o  . GERD (gastroesophageal reflux disease)   . Obesity, morbid   . Anticardiolipin antibody syndrome   . Hepatitis, autoimmune   . Abnormal menses   . Sleep apnea      mild to moderate  per apnea link y 3-12  . Chronic headaches   . Chronic lower back pain    Past Surgical History  Procedure Date  . Dilation and curettage of uterus 11/2008    No Known Allergies  History   Social History  . Marital Status: Married    Spouse Name: N/A    Number of Children: 1  . Years of Education: N/A   Occupational History  . stay home mom   . property Ship broker    Social History Main Topics  . Smoking status: Current Some Day Smoker -- 0.1 packs/day    Start date: 12/31/1996  . Smokeless tobacco: Never Used   Comment: Started  smoking at age   . Alcohol Use: Yes     occasionally-3 drinks per weekend  . Drug Use: No  . Sexually Active: Not on file   Other Topics Concern  . Not on file   Social History Narrative   Diet: unchanged, has lost some wt ----Exercise : more active than before      Review of Systems  Constitutional: Positive for fever. Negative for unexpected weight change.  HENT: Positive for nosebleeds, congestion, rhinorrhea, sneezing, trouble swallowing, dental problem, postnasal drip and sinus pressure. Negative for ear pain and sore throat.   Eyes: Positive for redness and itching.  Respiratory: Positive for chest tightness, shortness of breath and wheezing. Negative for cough.   Cardiovascular: Positive for chest pain and palpitations. Negative for leg swelling.  Gastrointestinal: Positive for nausea, vomiting and abdominal pain. Negative for abdominal distention.  Genitourinary: Negative for dysuria.  Musculoskeletal: Positive for joint swelling.  Skin: Negative for rash.  Neurological: Positive for headaches.  Hematological: Bruises/bleeds easily.  Psychiatric/Behavioral: Positive for dysphoric mood. The patient is nervous/anxious.        Objective:   Physical Exam  Gen. Pleasant, obese, in no distress, normal affect ENT - no lesions, no post nasal drip, class 2-3 airway Neck: No JVD, no thyromegaly, no carotid bruits Lungs: no use of  accessory muscles, no dullness to percussion, decreased without rales or rhonchi  Cardiovascular: Rhythm regular, heart sounds  normal, no murmurs or gallops, no peripheral edema Abdomen: soft and non-tender, no hepatosplenomegaly, BS normal. Musculoskeletal: No deformities, no cyanosis or clubbing Neuro:  alert, non focal, no tremors       Assessment & Plan:

## 2012-09-23 NOTE — Patient Instructions (Signed)
Sleep study in the lab Trial of MELATONIN 5 MG - take at 8 pm for sleep maintenance

## 2012-09-23 NOTE — Assessment & Plan Note (Signed)
Given excessive daytime somnolence, narrow pharyngeal exam, witnessed apneas & loud snoring, obstructive sleep apnea is very likely & an overnight polysomnogram will be scheduled as a split study. The pathophysiology of obstructive sleep apnea , it's cardiovascular consequences & modes of treatment including CPAP were discused with the patient in detail & they evidenced understanding.  

## 2012-09-26 ENCOUNTER — Other Ambulatory Visit: Payer: Self-pay | Admitting: *Deleted

## 2012-09-26 ENCOUNTER — Ambulatory Visit: Payer: 59

## 2012-09-26 ENCOUNTER — Ambulatory Visit (INDEPENDENT_AMBULATORY_CARE_PROVIDER_SITE_OTHER): Payer: 59

## 2012-09-26 DIAGNOSIS — Z7901 Long term (current) use of anticoagulants: Secondary | ICD-10-CM

## 2012-09-26 DIAGNOSIS — I82409 Acute embolism and thrombosis of unspecified deep veins of unspecified lower extremity: Secondary | ICD-10-CM

## 2012-09-26 LAB — POCT INR: INR: 2.8

## 2012-09-26 MED ORDER — OXYCODONE HCL 5 MG PO CAPS: 5.0000 mg | ORAL_CAPSULE | Freq: Two times a day (BID) | ORAL | Status: DC | PRN

## 2012-09-26 NOTE — Telephone Encounter (Signed)
Pt aware rx is ready to be picked up.

## 2012-09-26 NOTE — Telephone Encounter (Signed)
Pt left msg requesting a refill for oxycodone 5mg  #60. Last filled 8.30.13. Last OV 8.30.13. OK to refill?

## 2012-09-26 NOTE — Telephone Encounter (Signed)
Done

## 2012-10-02 ENCOUNTER — Institutional Professional Consult (permissible substitution): Payer: 59 | Admitting: Pulmonary Disease

## 2012-10-03 ENCOUNTER — Ambulatory Visit
Admission: RE | Admit: 2012-10-03 | Discharge: 2012-10-03 | Disposition: A | Discharge status: Home / Self Care | Payer: 59 | Source: Ambulatory Visit | Attending: Gastroenterology | Admitting: Gastroenterology

## 2012-10-03 ENCOUNTER — Other Ambulatory Visit: Payer: 59

## 2012-10-03 DIAGNOSIS — K754 Autoimmune hepatitis: Secondary | ICD-10-CM

## 2012-10-07 ENCOUNTER — Other Ambulatory Visit: Payer: Self-pay | Admitting: Pain Medicine

## 2012-10-07 DIAGNOSIS — M545 Low back pain, unspecified: Secondary | ICD-10-CM

## 2012-10-12 ENCOUNTER — Ambulatory Visit (HOSPITAL_BASED_OUTPATIENT_CLINIC_OR_DEPARTMENT_OTHER): Payer: 59 | Attending: Pulmonary Disease | Admitting: Radiology

## 2012-10-12 VITALS — Ht 59.0 in | Wt 270.0 lb

## 2012-10-12 DIAGNOSIS — Z6841 Body Mass Index (BMI) 40.0 and over, adult: Secondary | ICD-10-CM | POA: Insufficient documentation

## 2012-10-12 DIAGNOSIS — E669 Obesity, unspecified: Secondary | ICD-10-CM | POA: Insufficient documentation

## 2012-10-12 DIAGNOSIS — Z7901 Long term (current) use of anticoagulants: Secondary | ICD-10-CM | POA: Insufficient documentation

## 2012-10-12 DIAGNOSIS — Z79899 Other long term (current) drug therapy: Secondary | ICD-10-CM | POA: Insufficient documentation

## 2012-10-12 DIAGNOSIS — R894 Abnormal immunological findings in specimens from other organs, systems and tissues: Secondary | ICD-10-CM | POA: Insufficient documentation

## 2012-10-12 DIAGNOSIS — G4730 Sleep apnea, unspecified: Secondary | ICD-10-CM

## 2012-10-12 DIAGNOSIS — G4733 Obstructive sleep apnea (adult) (pediatric): Secondary | ICD-10-CM

## 2012-10-14 DIAGNOSIS — G4730 Sleep apnea, unspecified: Secondary | ICD-10-CM

## 2012-10-14 DIAGNOSIS — G4710 Hypersomnia, unspecified: Secondary | ICD-10-CM

## 2012-10-15 NOTE — Procedures (Signed)
NAME:  Margaret [name], Margaret [name]               ACCOUNT NO.:  [id number]  MEDICAL RECORD NO.:  [id number]          PATIENT TYPE:  OUT  LOCATION:  SLEEP CENTER                 FACILITY:  [facility]  PHYSICIAN:  Oretha Milch, MD      DATE OF BIRTH:  04-Mar-1975  DATE OF STUDY:  10/12/2012                           NOCTURNAL POLYSOMNOGRAM  REFERRING PHYSICIAN:  Oretha Milch, MD  INDICATION FOR STUDY:  Margaret [name] is a 37 year old obese woman with anti cardiolipin antibody syndrome.  A home sleep study in March 2012 showed an RDI of 15 per hour, oxygen desaturation index of 18 per hour, and desaturation less than 88% for 30 minutes.  At the time of this study, she weighed 270 pounds with a height of 4 feet and 11 inches, BMI of 55, neck size of 14 inches.  EPWORTH SLEEPINESS SCORE:  17.  MEDICATIONS:  Bedtime medications included warfarin, citalopram, oxycodone, azathioprine, melatonin, and budesonide at 9 p.m.  This nocturnal polysomnogram was performed with a sleep technologist in attendance.  EEG, EOG, EMG, EKG, and respiratory parameters were recorded.  Sleep stages, arousals, limb movements, and respiratory data were scored according to criteria laid out by the American Academy of Sleep Medicine.  SLEEP ARCHITECTURE:  Lights out was at 10:44 p.m., lights on was at 5:01 a.m., total sleep time was 276 minutes with a sleep period time of 377 minutes and a sleep efficiency of 73%.  Sleep latency was 70 minutes and latency to REM sleep was 261 minutes.  Awake after sleep onset was 33 minutes.  Sleep stages of the percentage of total sleep time was N1 13%, N2 83%, N3 30%, REM sleep 1.3% (3.5 minutes).  Supine sleep accounted for 104 minutes.  Supine REM sleep was not noted.  Arousal Data:  There were 114 arousals with an arousal index of 25 events per hour.  Of these, 107 were spontaneous and the rest were associated with respiratory events.  RESPIRATORY DATA:  There were total of 0 obstructive  apneas, 2 central apneas, 0 mixed apneas, and 7 hypopneas with an apnea-hypopnea index of 2 events per hour, however 49 RED (respiratory effort related arousals) were noted with an RDI of 13 events per hour.  The longest hypopnea was 40 seconds.  OXYGEN DATA:  The desaturation index was 5 events per hour.  She spent 0.2 minutes with a saturation less than 88%.  CARDIAC DATA:  The low heart rate was 56 beats per minute.  The high heart rate was 94 beats per minute.  No arrhythmias were noted.  Discussion:  She was desensitized with small nasal pillows, but did not meet split night criteria.  MOVEMENT-PARASOMNIA:  No significant limb movements were noted.  IMPRESSIONS-RECOMMENDATIONS: 1. Mild obstructive sleep apnea or upper airway resistance syndrome     causing sleep fragmentation and mild oxygen desaturation. 2. No evidence of cardiac arrhythmias, limb movements, or behavioral     disturbance during sleep. 3. Abnormal sleep architecture with suppressed REM sleep probably due     to medication and sleep fragmentation due to respiratory effort     related arousals.  Recommendation: 1. Treatment options for this degree of sleep-disordered  breathing     include weight loss, oral appliance, or CPAP therapy. 2. She should be cautioned against medications sedative side effects.     She should be advised against driving when sleepy.     Oretha Milch, MD    RVA/MEDQ  D:  10/14/2012 13:20:34  T:  10/15/2012 04:53:07  Job:  [id number]

## 2012-10-16 ENCOUNTER — Telehealth: Payer: Self-pay | Admitting: Pulmonary Disease

## 2012-10-16 ENCOUNTER — Ambulatory Visit
Admission: RE | Admit: 2012-10-16 | Discharge: 2012-10-16 | Disposition: A | Discharge status: Home / Self Care | Payer: 59 | Source: Ambulatory Visit | Attending: Pain Medicine | Admitting: Pain Medicine

## 2012-10-16 DIAGNOSIS — M545 Low back pain: Secondary | ICD-10-CM

## 2012-10-16 NOTE — Telephone Encounter (Signed)
Mild OSA on sleep study Proceed with CPAP if pt willing Can order - auoCPAP 5-12 cm , nasal pillows, humidity, download in 2 weeks & FU

## 2012-10-22 ENCOUNTER — Telehealth: Payer: Self-pay | Admitting: *Deleted

## 2012-10-22 NOTE — Telephone Encounter (Signed)
Margaret [name]., MD 10/16/2012 12:18 PM Signed  Mild OSA on sleep study  Proceed with CPAP if pt willing  Can order - auoCPAP 5-12 cm , nasal pillows, humidity, download in 2 weeks & FU  lmomtcb x1

## 2012-10-22 NOTE — Telephone Encounter (Signed)
Pt returned my call. She stated she would like to know what other tx can she use other than using a cpap machine. She would like to know what options are available. Pt aware RA is out of the office. Please advise thanks

## 2012-10-28 ENCOUNTER — Ambulatory Visit (INDEPENDENT_AMBULATORY_CARE_PROVIDER_SITE_OTHER): Payer: 59

## 2012-10-28 DIAGNOSIS — Z7901 Long term (current) use of anticoagulants: Secondary | ICD-10-CM

## 2012-10-28 DIAGNOSIS — I82409 Acute embolism and thrombosis of unspecified deep veins of unspecified lower extremity: Secondary | ICD-10-CM

## 2012-10-29 ENCOUNTER — Ambulatory Visit: Payer: 59 | Admitting: Internal Medicine

## 2012-10-31 ENCOUNTER — Other Ambulatory Visit: Payer: Self-pay | Admitting: Internal Medicine

## 2012-10-31 NOTE — Telephone Encounter (Signed)
Refill done.  

## 2012-11-04 NOTE — Telephone Encounter (Signed)
Can use oral appliance But this would not be -as effective as CPAP - would need dental consult & payment to make this up front before we know whether it is working Hence would prefer & recommend CPAP trial first

## 2012-11-05 ENCOUNTER — Other Ambulatory Visit: Payer: Self-pay | Admitting: Gastroenterology

## 2012-11-05 ENCOUNTER — Telehealth: Payer: Self-pay | Admitting: Internal Medicine

## 2012-11-05 DIAGNOSIS — R9389 Abnormal findings on diagnostic imaging of other specified body structures: Secondary | ICD-10-CM

## 2012-11-05 NOTE — Telephone Encounter (Signed)
Can arrange OV in future  if she wants to discuss options in more detail Otherwise pl fwd copy of this note to PCP

## 2012-11-05 NOTE — Telephone Encounter (Signed)
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 She liked to know who is handling her hepatitis, is Dr. Randa Evens. She'll try to contact him and if unable to, will call me back

## 2012-11-05 NOTE — Telephone Encounter (Signed)
I have forwarded note to Dr. Drue Novel, pt pcp.

## 2012-11-05 NOTE — Telephone Encounter (Signed)
I spoke with pt and she is aware of RA response. She stated she does not want a cpap machine bc it is uncomfortable and does not want to pay for something she won't. She did not want to try oral appliance either. She stated she would prefer to stay how she is w/o any machines/oral appliance and won;t have to deal with the hansel. She does not want to be set up with anything. Will forward to Dr. Vassie Loll so he is aware pt is refusing tx.

## 2012-11-06 ENCOUNTER — Ambulatory Visit (INDEPENDENT_AMBULATORY_CARE_PROVIDER_SITE_OTHER): Payer: 59 | Admitting: Internal Medicine

## 2012-11-06 VITALS — BP 130/86 | HR 83 | Temp 97.7°F | Wt 269.0 lb

## 2012-11-06 DIAGNOSIS — F329 Major depressive disorder, single episode, unspecified: Secondary | ICD-10-CM

## 2012-11-06 DIAGNOSIS — M549 Dorsalgia, unspecified: Secondary | ICD-10-CM

## 2012-11-06 DIAGNOSIS — G4730 Sleep apnea, unspecified: Secondary | ICD-10-CM

## 2012-11-06 DIAGNOSIS — R5383 Other fatigue: Secondary | ICD-10-CM

## 2012-11-06 DIAGNOSIS — G4733 Obstructive sleep apnea (adult) (pediatric): Secondary | ICD-10-CM

## 2012-11-06 DIAGNOSIS — R5381 Other malaise: Secondary | ICD-10-CM

## 2012-11-06 DIAGNOSIS — E559 Vitamin D deficiency, unspecified: Secondary | ICD-10-CM

## 2012-11-06 MED ORDER — ESCITALOPRAM OXALATE 10 MG PO TABS: 15.0000 mg | ORAL_TABLET | Freq: Every day | ORAL | Status: DC

## 2012-11-06 NOTE — Assessment & Plan Note (Signed)
Likely multifactorial, unable to tolerate a CPAP. History of low vitamin D, labs

## 2012-11-06 NOTE — Assessment & Plan Note (Addendum)
See history of present illness, MRI showed retroperitoneal adenopathy, pain management  discussed the results with GI, she is scheduled to have a CT with contrast for further evaluation

## 2012-11-06 NOTE — Progress Notes (Signed)
  Subjective:    Patient ID: Margaret [name], female    DOB: Jun 28, 1975, 37 y.o.   MRN: [id number]  HPI Followup visit, Depression, good compliance with Lexapro, no apparent side effects, feeling better overall Fatigue, sleep apnea, saw pulmonary med, sleep study again show sleep apnea, she reports that tried again a CPAP and was unable to tolerate. Back pain, saw the pain specialist, MRI was done, report reviewed: No lumbar stenosis but she has some retroperitoneal adenopathy is, could be lymphoma. See assessment and plan.  Past Medical History: DVT--1996, 200 GERD OBESITY, MORBID   ANTICARDIOLIPIN ANTIBODY SYNDROME   HEPATITIS AUTOINMUNE --NAFLD Abnormal menses Sleep apnea her apnea lynk 03-2011  Past Surgical History: D&C approx 11/2008  Social History: Married one child quit tobacco in December 2010 not working at present     Review of Systems     Objective:   Physical Exam General -- alert, well-developed, and overweight appearing. No apparent distress.  Psych-- Cognition and judgment appear intact. Alert and cooperative with normal attention span and concentration.  not anxious appearing and not depressed appearing.   PHP 9 repeated     Assessment & Plan:

## 2012-11-06 NOTE — Patient Instructions (Addendum)
Next visit in 4-5 months, fasting for a physical exam

## 2012-11-06 NOTE — Assessment & Plan Note (Signed)
Patient reports recent sleep study again show sleep apnea, unable to tolerate the CPAP

## 2012-11-06 NOTE — Assessment & Plan Note (Deleted)
Patient reports recent sleep study again show sleep apnea, unable to tolerate the CPAP 

## 2012-11-06 NOTE — Assessment & Plan Note (Signed)
Good compliance with Lexapro, PHQ-9 decreased from 19 to 14 Plan: increase lexapro to 15 mg a day

## 2012-11-07 ENCOUNTER — Encounter: Payer: Self-pay | Admitting: Internal Medicine

## 2012-11-12 ENCOUNTER — Encounter: Payer: Self-pay | Admitting: *Deleted

## 2012-11-14 ENCOUNTER — Other Ambulatory Visit: Payer: 59

## 2012-11-21 ENCOUNTER — Ambulatory Visit
Admission: RE | Admit: 2012-11-21 | Discharge: 2012-11-21 | Disposition: A | Discharge status: Home / Self Care | Payer: 59 | Source: Ambulatory Visit | Attending: Gastroenterology | Admitting: Gastroenterology

## 2012-11-21 DIAGNOSIS — R9389 Abnormal findings on diagnostic imaging of other specified body structures: Secondary | ICD-10-CM

## 2012-11-21 MED ORDER — IOHEXOL 300 MG/ML  SOLN
125.0000 mL | Freq: Once | INTRAMUSCULAR | Status: AC | PRN
  Administered 2012-11-21: 125 mL via INTRAVENOUS

## 2012-11-22 ENCOUNTER — Other Ambulatory Visit: Payer: Self-pay | Admitting: Internal Medicine

## 2012-11-24 ENCOUNTER — Other Ambulatory Visit: Payer: Self-pay | Admitting: Internal Medicine

## 2012-11-24 MED ORDER — WARFARIN SODIUM 5 MG PO TABS: ORAL_TABLET | ORAL | Status: DC

## 2012-11-24 NOTE — Telephone Encounter (Signed)
refill Warfarin Sodium (Tab) COUMADIN 5 MG TAKE AS DIRECTED BY [facility] last fill 11.1.13--last ov 11.7.13

## 2012-11-24 NOTE — Telephone Encounter (Signed)
Refill done.  

## 2012-11-25 ENCOUNTER — Ambulatory Visit (INDEPENDENT_AMBULATORY_CARE_PROVIDER_SITE_OTHER): Payer: 59 | Admitting: *Deleted

## 2012-11-25 DIAGNOSIS — I82409 Acute embolism and thrombosis of unspecified deep veins of unspecified lower extremity: Secondary | ICD-10-CM

## 2012-11-25 DIAGNOSIS — Z7901 Long term (current) use of anticoagulants: Secondary | ICD-10-CM

## 2012-12-16 ENCOUNTER — Ambulatory Visit (INDEPENDENT_AMBULATORY_CARE_PROVIDER_SITE_OTHER): Payer: 59 | Admitting: *Deleted

## 2012-12-16 ENCOUNTER — Other Ambulatory Visit: Payer: Self-pay | Admitting: *Deleted

## 2012-12-16 DIAGNOSIS — Z7901 Long term (current) use of anticoagulants: Secondary | ICD-10-CM

## 2012-12-16 DIAGNOSIS — I82409 Acute embolism and thrombosis of unspecified deep veins of unspecified lower extremity: Secondary | ICD-10-CM

## 2012-12-16 LAB — POCT INR: INR: 1.7

## 2012-12-16 NOTE — Telephone Encounter (Signed)
Pt called stating that the pharmacy would not refill her escitalopram 10mg . I called the pharmacy to verify the problem. They pharmacy stated that the med needed a prior auth.  I called to have the prior auth form faxed to the office.

## 2012-12-17 NOTE — Telephone Encounter (Signed)
Received confirmation that escitalopram 10mg . 1.5 tablets daily. Pt & pharmacy made aware.

## 2013-01-08 ENCOUNTER — Ambulatory Visit (INDEPENDENT_AMBULATORY_CARE_PROVIDER_SITE_OTHER): Payer: BC Managed Care – PPO | Admitting: *Deleted

## 2013-01-08 DIAGNOSIS — Z7901 Long term (current) use of anticoagulants: Secondary | ICD-10-CM

## 2013-01-08 DIAGNOSIS — I82409 Acute embolism and thrombosis of unspecified deep veins of unspecified lower extremity: Secondary | ICD-10-CM

## 2013-01-08 LAB — POCT INR: INR: 2.3

## 2013-01-30 ENCOUNTER — Ambulatory Visit (INDEPENDENT_AMBULATORY_CARE_PROVIDER_SITE_OTHER): Payer: BC Managed Care – PPO | Admitting: *Deleted

## 2013-01-30 DIAGNOSIS — Z7901 Long term (current) use of anticoagulants: Secondary | ICD-10-CM

## 2013-01-30 DIAGNOSIS — I82409 Acute embolism and thrombosis of unspecified deep veins of unspecified lower extremity: Secondary | ICD-10-CM

## 2013-02-10 ENCOUNTER — Ambulatory Visit (INDEPENDENT_AMBULATORY_CARE_PROVIDER_SITE_OTHER): Payer: BC Managed Care – PPO | Admitting: *Deleted

## 2013-02-10 DIAGNOSIS — I82409 Acute embolism and thrombosis of unspecified deep veins of unspecified lower extremity: Secondary | ICD-10-CM

## 2013-02-10 DIAGNOSIS — Z7901 Long term (current) use of anticoagulants: Secondary | ICD-10-CM

## 2013-03-02 ENCOUNTER — Other Ambulatory Visit: Payer: Self-pay | Admitting: Internal Medicine

## 2013-03-02 NOTE — Telephone Encounter (Signed)
Refill done.  

## 2013-04-02 ENCOUNTER — Other Ambulatory Visit: Payer: Self-pay | Admitting: Internal Medicine

## 2013-04-02 NOTE — Telephone Encounter (Signed)
Refill done.  

## 2013-05-03 ENCOUNTER — Other Ambulatory Visit: Payer: Self-pay | Admitting: Internal Medicine

## 2013-05-04 NOTE — Telephone Encounter (Signed)
RF per coumadin clinic

## 2013-05-04 NOTE — Telephone Encounter (Signed)
Please clarify on who is filling this med. The pt is getting his coumadin checks done at the coumadin clinic.

## 2013-05-06 ENCOUNTER — Other Ambulatory Visit: Payer: Self-pay | Admitting: General Practice

## 2013-05-06 ENCOUNTER — Other Ambulatory Visit: Payer: Self-pay | Admitting: Internal Medicine

## 2013-05-08 ENCOUNTER — Ambulatory Visit (INDEPENDENT_AMBULATORY_CARE_PROVIDER_SITE_OTHER): Payer: BC Managed Care – PPO

## 2013-05-08 DIAGNOSIS — I82409 Acute embolism and thrombosis of unspecified deep veins of unspecified lower extremity: Secondary | ICD-10-CM

## 2013-05-08 DIAGNOSIS — Z7901 Long term (current) use of anticoagulants: Secondary | ICD-10-CM

## 2013-05-08 MED ORDER — WARFARIN SODIUM 5 MG PO TABS: ORAL_TABLET | ORAL | Status: DC

## 2013-05-19 ENCOUNTER — Encounter: Payer: Self-pay | Admitting: Lab

## 2013-05-20 ENCOUNTER — Ambulatory Visit: Payer: BC Managed Care – PPO | Admitting: Internal Medicine

## 2013-05-22 ENCOUNTER — Ambulatory Visit (INDEPENDENT_AMBULATORY_CARE_PROVIDER_SITE_OTHER): Payer: BC Managed Care – PPO | Admitting: *Deleted

## 2013-05-22 DIAGNOSIS — Z7901 Long term (current) use of anticoagulants: Secondary | ICD-10-CM

## 2013-05-22 DIAGNOSIS — I82409 Acute embolism and thrombosis of unspecified deep veins of unspecified lower extremity: Secondary | ICD-10-CM

## 2013-06-12 ENCOUNTER — Ambulatory Visit (INDEPENDENT_AMBULATORY_CARE_PROVIDER_SITE_OTHER): Payer: BC Managed Care – PPO

## 2013-06-12 DIAGNOSIS — I82409 Acute embolism and thrombosis of unspecified deep veins of unspecified lower extremity: Secondary | ICD-10-CM

## 2013-06-12 DIAGNOSIS — Z7901 Long term (current) use of anticoagulants: Secondary | ICD-10-CM

## 2013-07-10 ENCOUNTER — Telehealth: Payer: Self-pay | Admitting: Pharmacist

## 2013-07-10 MED ORDER — WARFARIN SODIUM 5 MG PO TABS: ORAL_TABLET | ORAL | Status: DC

## 2013-07-10 NOTE — Telephone Encounter (Signed)
Refill Request    WARFARIN

## 2013-07-13 ENCOUNTER — Telehealth: Payer: Self-pay | Admitting: *Deleted

## 2013-07-13 DIAGNOSIS — F329 Major depressive disorder, single episode, unspecified: Secondary | ICD-10-CM

## 2013-07-13 NOTE — Telephone Encounter (Signed)
Ok # 30 and 2 refills. Please schedule a ROV or  physical within 3 months.

## 2013-07-13 NOTE — Telephone Encounter (Signed)
Pt. Called requesting refill on her Lexapro 10mg . Pt. Last OV 11.7.13 and was last filled same date for #45 with 5 refills. Please advise. Thanks.

## 2013-07-14 ENCOUNTER — Ambulatory Visit (INDEPENDENT_AMBULATORY_CARE_PROVIDER_SITE_OTHER): Payer: BC Managed Care – PPO | Admitting: *Deleted

## 2013-07-14 DIAGNOSIS — I82409 Acute embolism and thrombosis of unspecified deep veins of unspecified lower extremity: Secondary | ICD-10-CM

## 2013-07-14 DIAGNOSIS — Z7901 Long term (current) use of anticoagulants: Secondary | ICD-10-CM

## 2013-07-14 LAB — POCT INR: INR: 1.5

## 2013-07-14 MED ORDER — ESCITALOPRAM OXALATE 10 MG PO TABS: 15.0000 mg | ORAL_TABLET | Freq: Every day | ORAL | Status: DC

## 2013-07-14 NOTE — Telephone Encounter (Signed)
Refill done per orders. Spoke with patient, scheduled for 08/11/13 at 1100.

## 2013-07-31 ENCOUNTER — Ambulatory Visit (INDEPENDENT_AMBULATORY_CARE_PROVIDER_SITE_OTHER): Payer: BC Managed Care – PPO | Admitting: *Deleted

## 2013-07-31 DIAGNOSIS — Z7901 Long term (current) use of anticoagulants: Secondary | ICD-10-CM

## 2013-07-31 DIAGNOSIS — I82409 Acute embolism and thrombosis of unspecified deep veins of unspecified lower extremity: Secondary | ICD-10-CM

## 2013-07-31 LAB — POCT INR: INR: 3

## 2013-08-11 ENCOUNTER — Encounter: Payer: Self-pay | Admitting: Internal Medicine

## 2013-08-11 ENCOUNTER — Ambulatory Visit (INDEPENDENT_AMBULATORY_CARE_PROVIDER_SITE_OTHER): Payer: BC Managed Care – PPO | Admitting: Internal Medicine

## 2013-08-11 VITALS — BP 130/90 | HR 85 | Temp 97.8°F | Wt 254.4 lb

## 2013-08-11 DIAGNOSIS — F329 Major depressive disorder, single episode, unspecified: Secondary | ICD-10-CM

## 2013-08-11 DIAGNOSIS — F3289 Other specified depressive episodes: Secondary | ICD-10-CM

## 2013-08-11 MED ORDER — BUPROPION HCL ER (XL) 150 MG PO TB24: 300.0000 mg | ORAL_TABLET | Freq: Every day | ORAL | Status: DC

## 2013-08-11 NOTE — Progress Notes (Signed)
  Subjective:    Patient ID: Margaret [name], female    DOB: 1975-01-31, 38 y.o.   MRN: [id number]  HPI Here for evaluation of depression. See assessment and plan. Chart review, vitamin D 10-2012 normal. TSH normal 07-2012  Past Medical History: DVT--1996, 200 GERD OBESITY, MORBID   ANTICARDIOLIPIN ANTIBODY SYNDROME   HEPATITIS AUTOINMUNE --NAFLD Abnormal menses Sleep apnea her apnea lynk 03-2011  Past Surgical History: D&C approx 11/2008  Social History: Married one child quit tobacco in December 2010 not working at present     Review of Systems Denies any suicidal ideas. Still has crying spells and feels sad and down from time to time. Back pain better, able to move better.    Objective:   Physical Exam BP 130/90  Pulse 85  Temp(Src) 97.8 F (36.6 C) (Oral)  Wt 254 lb 6.4 oz (115.395 kg)  BMI 51.36 kg/m2  SpO2 94%  General -- alert, well-developed, NAD Neurologic-- alert & oriented X3 and strength normal in all extremities. Psych-- Cognition and judgment appear intact. Alert and cooperative with normal attention span and concentration.  not anxious appearing and not depressed appearing.       Assessment & Plan:

## 2013-08-11 NOTE — Assessment & Plan Note (Signed)
Continue with depression, still sad and down at times but most bothersome to her she has "no excitement, I am just here" . "won't express my emotions at all ". PHQ 9 today---- 20 She does not feel like doing anything, she does not think is a physical issue because her back pain is better she thinks is more mental issue. We talk about possibly a referral to psychiatry but she likes to try something different first. At this point, we can add Wellbutrin to Lexapro. Consider switch to Cymbalta or another agent. Again, if she's not better, will refer to psychiatry. She's not suicidal.

## 2013-08-11 NOTE — Patient Instructions (Addendum)
Start Wellbutrin XL 150 mg one tablet daily for one week, then increase to 2 tablets daily. Call if side effects. Next visit in 6 weeks.

## 2013-08-12 ENCOUNTER — Other Ambulatory Visit: Payer: Self-pay | Admitting: Cardiology

## 2013-08-28 ENCOUNTER — Ambulatory Visit (INDEPENDENT_AMBULATORY_CARE_PROVIDER_SITE_OTHER): Payer: BC Managed Care – PPO

## 2013-08-28 DIAGNOSIS — I82409 Acute embolism and thrombosis of unspecified deep veins of unspecified lower extremity: Secondary | ICD-10-CM

## 2013-08-28 DIAGNOSIS — Z7901 Long term (current) use of anticoagulants: Secondary | ICD-10-CM

## 2013-08-28 LAB — POCT INR: INR: 2.7

## 2013-10-09 ENCOUNTER — Other Ambulatory Visit: Payer: Self-pay | Admitting: Gastroenterology

## 2013-10-09 DIAGNOSIS — K754 Autoimmune hepatitis: Secondary | ICD-10-CM

## 2013-10-16 ENCOUNTER — Other Ambulatory Visit: Payer: Self-pay | Admitting: Pain Medicine

## 2013-10-16 ENCOUNTER — Ambulatory Visit
Admission: RE | Admit: 2013-10-16 | Discharge: 2013-10-16 | Disposition: A | Discharge status: Home / Self Care | Payer: BC Managed Care – PPO | Source: Ambulatory Visit | Attending: Pain Medicine | Admitting: Pain Medicine

## 2013-10-16 ENCOUNTER — Ambulatory Visit
Admission: RE | Admit: 2013-10-16 | Discharge: 2013-10-16 | Disposition: A | Discharge status: Home / Self Care | Payer: BC Managed Care – PPO | Source: Ambulatory Visit | Attending: Gastroenterology | Admitting: Gastroenterology

## 2013-10-16 DIAGNOSIS — R52 Pain, unspecified: Secondary | ICD-10-CM

## 2013-10-16 DIAGNOSIS — K754 Autoimmune hepatitis: Secondary | ICD-10-CM

## 2013-10-20 ENCOUNTER — Other Ambulatory Visit (HOSPITAL_COMMUNITY): Payer: Self-pay | Admitting: Gastroenterology

## 2013-10-20 ENCOUNTER — Telehealth: Payer: Self-pay | Admitting: Internal Medicine

## 2013-10-20 DIAGNOSIS — R112 Nausea with vomiting, unspecified: Secondary | ICD-10-CM

## 2013-10-20 NOTE — Telephone Encounter (Signed)
Patient called to tell dr Drue Novel that she is going to be having surgery soon and wanted dr Drue Novel to be on standby if they had any questions. Also wanted to talk to dr Drue Novel nurse about it.

## 2013-10-20 NOTE — Telephone Encounter (Signed)
Attempted to return pts call, left message on voice mail.

## 2013-10-23 ENCOUNTER — Other Ambulatory Visit: Payer: Self-pay | Admitting: Internal Medicine

## 2013-10-23 ENCOUNTER — Ambulatory Visit (INDEPENDENT_AMBULATORY_CARE_PROVIDER_SITE_OTHER): Payer: BC Managed Care – PPO | Admitting: *Deleted

## 2013-10-23 DIAGNOSIS — Z7901 Long term (current) use of anticoagulants: Secondary | ICD-10-CM

## 2013-10-23 DIAGNOSIS — I82409 Acute embolism and thrombosis of unspecified deep veins of unspecified lower extremity: Secondary | ICD-10-CM

## 2013-10-23 LAB — POCT INR: INR: 1.6

## 2013-10-23 MED ORDER — WARFARIN SODIUM 5 MG PO TABS: ORAL_TABLET | ORAL | Status: DC

## 2013-10-26 NOTE — Telephone Encounter (Signed)
rx refilled per protocol. DJR  

## 2013-11-02 ENCOUNTER — Encounter (HOSPITAL_COMMUNITY)
Admission: RE | Admit: 2013-11-02 | Discharge: 2013-11-02 | Disposition: A | Discharge status: Home / Self Care | Payer: BC Managed Care – PPO | Source: Ambulatory Visit | Attending: Gastroenterology | Admitting: Gastroenterology

## 2013-11-02 ENCOUNTER — Encounter (HOSPITAL_COMMUNITY): Payer: Self-pay

## 2013-11-02 DIAGNOSIS — R112 Nausea with vomiting, unspecified: Secondary | ICD-10-CM | POA: Insufficient documentation

## 2013-11-02 MED ORDER — TECHNETIUM TC 99M MEBROFENIN IV KIT
5.5000 | PACK | Freq: Once | INTRAVENOUS | Status: AC | PRN
  Administered 2013-11-02: 5.5 via INTRAVENOUS

## 2013-11-05 ENCOUNTER — Telehealth: Payer: Self-pay | Admitting: *Deleted

## 2013-11-05 NOTE — Telephone Encounter (Signed)
[facility] Triage Call Report Triage Record Num: [id number] Operator: Donnella Sham Patient Name: Margaret [name] Call Date & Time: 11/04/2013 8:09:26PM Patient Phone: [phone] PCP: Nolon Rod. Paz Patient Gender: Female PCP Fax : Patient DOB: 1975/07/15 Practice Name: [facility] Reason for Call: Caller: Krisa/Patient; PCP: Willow Ora; CB#: [phone]; Call regarding HIDA scan 11/02/13; belly button leaking clear fluid since 11/3; using gauze and cleaning with salt water; having to change clothes TID; spoke to MD that did the scan and he was unsure what that could be from; afebrile; denies other sxs; drinking fluids d/t gallstones; tired; spoke with Dr.Tullo; advised continuing what she is doing and mention the problem 11/7 to her surgeon; advised use of ED for fever or s/s infection, like redness, pain, or pus like fluid Protocol(s) Used: Office Note Recommended Outcome per Protocol: Information Noted and Sent to Office Reason for Outcome: Caller information to office Care Advice: ~ 11/

## 2013-11-06 ENCOUNTER — Ambulatory Visit (INDEPENDENT_AMBULATORY_CARE_PROVIDER_SITE_OTHER): Payer: BC Managed Care – PPO | Admitting: Pharmacist

## 2013-11-06 ENCOUNTER — Ambulatory Visit (INDEPENDENT_AMBULATORY_CARE_PROVIDER_SITE_OTHER): Payer: BC Managed Care – PPO | Admitting: Surgery

## 2013-11-06 ENCOUNTER — Encounter (INDEPENDENT_AMBULATORY_CARE_PROVIDER_SITE_OTHER): Payer: Self-pay | Admitting: Surgery

## 2013-11-06 ENCOUNTER — Other Ambulatory Visit (INDEPENDENT_AMBULATORY_CARE_PROVIDER_SITE_OTHER): Payer: Self-pay

## 2013-11-06 ENCOUNTER — Telehealth (INDEPENDENT_AMBULATORY_CARE_PROVIDER_SITE_OTHER): Payer: Self-pay | Admitting: *Deleted

## 2013-11-06 ENCOUNTER — Encounter (INDEPENDENT_AMBULATORY_CARE_PROVIDER_SITE_OTHER): Payer: Self-pay

## 2013-11-06 DIAGNOSIS — Z7901 Long term (current) use of anticoagulants: Secondary | ICD-10-CM

## 2013-11-06 DIAGNOSIS — K439 Ventral hernia without obstruction or gangrene: Secondary | ICD-10-CM

## 2013-11-06 DIAGNOSIS — K429 Umbilical hernia without obstruction or gangrene: Secondary | ICD-10-CM

## 2013-11-06 DIAGNOSIS — I82409 Acute embolism and thrombosis of unspecified deep veins of unspecified lower extremity: Secondary | ICD-10-CM

## 2013-11-06 LAB — POCT INR: INR: 2.1

## 2013-11-06 NOTE — Telephone Encounter (Signed)
I called pt to inform her of her appt for the CT scan at [facility] on 11/10/13 with an arrival time of 3:00 pm.  I instructed pt to drink 1st bottle of contrast at 1:20pm and 2nd bottle at 2:20pm.  Also pt knows to not have any solid food 4 hours prior to scan.  I informed pt that Meagan would be calling her with a follow up appt after the CT scan next week.  Pt agreeable.

## 2013-11-06 NOTE — Patient Instructions (Signed)
Laparoscopic Cholecystectomy Laparoscopic cholecystectomy is surgery to remove the gallbladder. The gallbladder is located slightly to the right of center in the abdomen, behind the liver. It is a concentrating and storage sac for the bile produced in the liver. Bile aids in the digestion and absorption of fats. Gallbladder disease (cholecystitis) is an inflammation of your gallbladder. This condition is usually caused by a buildup of gallstones (cholelithiasis) in your gallbladder. Gallstones can block the flow of bile, resulting in inflammation and pain. In severe cases, emergency surgery may be required. When emergency surgery is not required, you will have time to prepare for the procedure. Laparoscopic surgery is an alternative to open surgery. Laparoscopic surgery usually has a shorter recovery time. Your common bile duct may also need to be examined and explored. Your caregiver will discuss this with you if he or she feels this should be done. If stones are found in the common bile duct, they may be removed. LET YOUR CAREGIVER KNOW ABOUT:  Allergies to food or medicine.  Medicines taken, including vitamins, herbs, eyedrops, over-the-counter medicines, and creams.  Use of steroids (by mouth or creams).  Previous problems with anesthetics or numbing medicines.  History of bleeding problems or blood clots.  Previous surgery.  Other health problems, including diabetes and kidney problems.  Possibility of pregnancy, if this applies. RISKS AND COMPLICATIONS All surgery is associated with risks. Some problems that may occur following this procedure include:  Infection.  Damage to the common bile duct, nerves, arteries, veins, or other internal organs such as the stomach or intestines.  Bleeding.  A stone may remain in the common bile duct. BEFORE THE PROCEDURE  Do not take aspirin for 3 days prior to surgery or blood thinners for 1 week prior to surgery.  Do not eat or drink  anything after midnight the night before surgery.  Let your caregiver know if you develop a cold or other infectious problem prior to surgery.  You should be present 60 minutes before the procedure or as directed. PROCEDURE  You will be given medicine that makes you sleep (general anesthetic). When you are asleep, your surgeon will make several small cuts (incisions) in your abdomen. One of these incisions is used to insert a small, lighted scope (laparoscope) into the abdomen. The laparoscope helps the surgeon see into your abdomen. Carbon dioxide gas will be pumped into your abdomen. The gas allows more room for the surgeon to perform your surgery. Other operating instruments are inserted through the other incisions. Laparoscopic procedures may not be appropriate when:  There is major scarring from previous surgery.  The gallbladder is extremely inflamed.  There are bleeding disorders or unexpected cirrhosis of the liver.  A pregnancy is near term.  Other conditions make the laparoscopic procedure impossible. If your surgeon feels it is not safe to continue with a laparoscopic procedure, he or she will perform an open abdominal procedure. In this case, the surgeon will make an incision to open the abdomen. This gives the surgeon a larger view and field to work within. This may allow the surgeon to perform procedures that sometimes cannot be performed with a laparoscope alone. Open surgery has a longer recovery time. AFTER THE PROCEDURE  You will be taken to the recovery area where a nurse will watch and check your progress.  You may be allowed to go home the same day.  Do not resume physical activities until directed by your caregiver.  You may resume a normal diet and   activities as directed. Document Released: 12/17/2005 Document Revised: 03/10/2012 Document Reviewed: 07/29/2013 [facility] Patient Information 2014 [place], [place]. Ventral Hernia A ventral hernia (also called an  incisional hernia) is a hernia that occurs at the site of a previous surgical cut (incision) in the abdomen. The abdominal wall spans from your lower chest down to your pelvis. If the abdominal wall is weakened from a surgical incision, a hernia can occur. A hernia is a bulge of bowel or muscle tissue pushing out on the weakened part of the abdominal wall. Ventral hernias can get bigger from straining or lifting. Obese and older people are at higher risk for a ventral hernia. People who develop infections after surgery or require repeat incisions at the same site on the abdomen are also at increased risk. CAUSES  A ventral hernia occurs because of weakness in the abdominal wall at an incision site.  SYMPTOMS  Common symptoms include:  A visible bulge or lump on the abdominal wall.  Pain or tenderness around the lump.  Increased discomfort if you cough or make a sudden movement. If the hernia has blocked part of the intestine, a serious complication can occur (incarcerated or strangulated hernia). This can become a problem that requires emergency surgery because the blood flow to the blocked intestine may be cut off. Symptoms may include:  Feeling sick to your stomach (nauseous).  Throwing up (vomiting).  Stomach swelling (distention) or bloating.  Fever.  Rapid heartbeat. DIAGNOSIS  Your caregiver will take a medical history and perform a physical exam. Various tests may be ordered, such as:  Blood tests.  Urine tests.  Ultrasonography.  X-rays.  Computed tomography (CT). TREATMENT  Watchful waiting may be all that is needed for a smaller hernia that does not cause symptoms. Your caregiver may recommend the use of a supportive belt (truss) that helps to keep the abdominal wall intact. For larger hernias or those that cause pain, surgery to repair the hernia is usually recommended. If a hernia becomes strangulated, emergency surgery needs to be done right away. HOME CARE  INSTRUCTIONS  Avoid putting pressure or strain on the abdominal area.  Avoid heavy lifting.  Use good body positioning for physical tasks. Ask your caregiver about proper body positioning.  Use a supportive belt as directed by your caregiver.  Maintain a healthy weight.  Eat foods that are high in fiber, such as whole grains, fruits, and vegetables. Fiber helps prevent difficult bowel movements (constipation).  Drink enough fluids to keep your urine clear or pale yellow.  Follow up with your caregiver as directed. SEEK MEDICAL CARE IF:   Your hernia seems to be getting larger or more painful. SEEK IMMEDIATE MEDICAL CARE IF:   You have abdominal pain that is sudden and sharp.  Your pain becomes severe.  You have repeated vomiting.  You are sweating a lot.  You notice a rapid heartbeat.  You develop a fever. MAKE SURE YOU:   Understand these instructions.  Will watch your condition.  Will get help right away if you are not doing well or get worse. Document Released: 12/03/2012 Document Reviewed: 12/03/2012 [facility] Patient Information 2014 [place], [place].

## 2013-11-06 NOTE — Progress Notes (Signed)
Chief Complaint:  Gallstones, chronically incarcerated umbilical hernia, umbilical drainage, and right sided abdominal pain.   History of Present Illness:  Margaret [name] is an 38 y.o. female referred by Dr. Randa Evens with the above-mentioned complaints. I reviewed her CT scan done 1 year ago as well as her recent ultrasound and her height is scant. She has gallstones and a normal HIDA.  Her main problems and then right-sided pain and nausea.  Past Medical History  Diagnosis Date  . DVT (deep venous thrombosis) 1996, 2000    h/o  . GERD (gastroesophageal reflux disease)   . Obesity, morbid   . Anticardiolipin antibody syndrome   . Hepatitis, autoimmune   . Abnormal menses   . Sleep apnea      mild to moderate  per apnea link y 3-12  . Chronic headaches   . Chronic lower back pain   . Autoimmune hepatitis     Past Surgical History  Procedure Laterality Date  . Dilation and curettage of uterus  11/2008    Current Outpatient Prescriptions  Medication Sig Dispense Refill  . azaTHIOprine (IMURAN) 50 MG tablet Take 50 mg by mouth daily. Takes 3 tablets daily.      . Azelastine HCl (ASTEPRO) 0.15 % SOLN Place into the nose. 2 sprays on each side of the nose two times a day as needed      . buPROPion (WELLBUTRIN XL) 300 MG 24 hr tablet TAKE 1 TABLET BY MOUTH DAILY  30 tablet  0  . escitalopram (LEXAPRO) 10 MG tablet TAKE 1 AND 1/2 TABLETS BY MOUTH DAILY.  30 tablet  2  . NON FORMULARY Muscle relaxant      . omeprazole (PRILOSEC) 20 MG capsule Take 20 mg by mouth as needed.        Marland Kitchen oxycodone (OXY-IR) 5 MG capsule Take 10 mg by mouth 2 (two) times daily as needed.      . tapentadol (NUCYNTA) 50 MG TABS tablet Take by mouth 2 (two) times daily.      Marland Kitchen warfarin (COUMADIN) 5 MG tablet Take as directed by coumadin clinic  50 tablet  1   No current facility-administered medications for this visit.   Influenza vaccines Family History  Problem Relation Age of Onset  . Adopted: Yes   Social  History:   reports that she quit smoking about 6 months ago. Her smoking use included Cigarettes. She started smoking about 16 years ago. She smoked 0.10 packs per day. She has never used smokeless tobacco. She reports that she drinks alcohol. She reports that she does not use illicit drugs.   REVIEW OF SYSTEMS - PERTINENT POSITIVES ONLY: DVT with anti-cardiolipin antibody syndrome  Physical Exam:   Blood pressure 150/102, pulse 80, temperature 98 F (36.7 C), temperature source Temporal, resp. rate 16, height 4\' 11"  (1.499 m), weight 245 lb (111.131 kg). Body mass index is 49.46 kg/(m^2).  Gen:  WDWN Lotz female NAD  Neurological: Alert and oriented to person, place, and time. Motor and sensory function is grossly intact  Head: Normocephalic and atraumatic.  Eyes: Conjunctivae are normal. Pupils are equal, round, and reactive to light. No scleral icterus.  Neck: Normal range of motion. Neck supple. No tracheal deviation or thyromegaly present.  Cardiovascular:  SR without murmurs or gallops.  No carotid bruits Respiratory: Effort normal.  No respiratory distress. No chest wall tenderness. Breath sounds normal.  No wheezes, rales or rhonchi.  Abdomen:  Obese with palpable mass above the umbilicus  and more fullness in the lower abdomen. She has had some drainage from her umbilicus over the last several days but has stopped. GU: Musculoskeletal: Normal range of motion. Extremities are nontender. No cyanosis, edema or clubbing noted Lymphadenopathy: No cervical, preauricular, postauricular or axillary adenopathy is present Skin: Skin is warm and dry. No rash noted. No diaphoresis. No erythema. No pallor. Pscyh: Normal mood and affect. Behavior is normal. Judgment and thought content normal.   LABORATORY RESULTS: No results found for this or any previous visit (from the past 48 hour(s)).  RADIOLOGY RESULTS: No results found.  Problem List: Patient Active Problem List   Diagnosis Date  Noted  . Depression 08/29/2012  . General medical examination 07/06/2011  . Hepatitis, autoimmune   . Encounter for long-term (current) use of anticoagulants 03/26/2011  . Sleep apnea   . ANEMIA 06/02/2010  . FATIGUE 07/29/2009  . BACK PAIN 01/21/2009  . DVT 06/26/2007  . OBESITY, MORBID 06/02/2007  . GERD 01/12/2007  . ANTICARDIOLIPIN ANTIBODY SYNDROME 01/12/2007    Assessment & Plan: I'm wondering whether her nausea is more related to her chronically incarcerated umbilical hernia it seems to contain small bowel. This recent drainage sounds serous may represent some drainage from the cavity. They would be worthwhile to repeat her CT scan specifically because the lower part of her abdomen seems more firm than it would appear on the CT a year ago. Also the right-sided abdominal pain does really go along with cholecystitis. I discussed we may need to go in and do laparoscopy with ventral hernia repair and possibly cholecystectomy. With her underlying medical issues and history of DVT and her morbid obesity she is at increased risk for surgery. I think the next first at this to obtain a CT scan and follow that up with a frank discussion about plans to repair her hernia and possibly take out her gallbladder    Matt B. Daphine Deutscher, MD, [facility] [phone] beeper [phone]  11/06/2013 12:03 PM

## 2013-11-10 ENCOUNTER — Telehealth: Payer: Self-pay | Admitting: Internal Medicine

## 2013-11-10 ENCOUNTER — Ambulatory Visit
Admission: RE | Admit: 2013-11-10 | Discharge: 2013-11-10 | Disposition: A | Discharge status: Home / Self Care | Payer: BC Managed Care – PPO | Source: Ambulatory Visit | Attending: Surgery | Admitting: Surgery

## 2013-11-10 DIAGNOSIS — K439 Ventral hernia without obstruction or gangrene: Secondary | ICD-10-CM

## 2013-11-10 MED ORDER — IOHEXOL 300 MG/ML  SOLN
125.0000 mL | Freq: Once | INTRAMUSCULAR | Status: AC | PRN
  Administered 2013-11-10: 125 mL via INTRAVENOUS

## 2013-11-11 ENCOUNTER — Ambulatory Visit (INDEPENDENT_AMBULATORY_CARE_PROVIDER_SITE_OTHER): Payer: BC Managed Care – PPO | Admitting: Surgery

## 2013-11-11 ENCOUNTER — Encounter (INDEPENDENT_AMBULATORY_CARE_PROVIDER_SITE_OTHER): Payer: Self-pay | Admitting: Surgery

## 2013-11-11 VITALS — BP 110/62 | HR 74 | Resp 18 | Ht 59.0 in | Wt 248.0 lb

## 2013-11-11 DIAGNOSIS — K766 Portal hypertension: Secondary | ICD-10-CM | POA: Insufficient documentation

## 2013-11-11 DIAGNOSIS — K439 Ventral hernia without obstruction or gangrene: Secondary | ICD-10-CM

## 2013-11-11 NOTE — Telephone Encounter (Signed)
Kennon Rounds in answer to your message  about coumadin: Patient to have surgery,   laparoscopic cholecystectomy and hernia repair, currently on Coumadin for Anticardiolipin antibody syndrome, history of 2 DVTs. Coumadin is managed by the Coumadin clinic. Per literature review plan is as follows: Hold Coumadin 5 days prior to surgery Does not need heparin pre-op Needs heparin bridge post op and restart coumadin as soon as surgery oks it

## 2013-11-11 NOTE — Progress Notes (Signed)
Margaret [name] 38 y.o.  Body mass index is 50.06 kg/(m^2).  Patient Active Problem List   Diagnosis Date Noted  . Portal hypertension 11/11/2013  . Ventral hernia 11/11/2013  . Umbilical hernia 11/06/2013  . Depression 08/29/2012  . General medical examination 07/06/2011  . Hepatitis, autoimmune   . Encounter for long-term (current) use of anticoagulants 03/26/2011  . Sleep apnea   . ANEMIA 06/02/2010  . FATIGUE 07/29/2009  . BACK PAIN 01/21/2009  . DVT 06/26/2007  . OBESITY, MORBID 06/02/2007  . GERD 01/12/2007  . ANTICARDIOLIPIN ANTIBODY SYNDROME 01/12/2007    Allergies  Allergen Reactions  . Influenza Vaccines     "Got sick x months"    Past Surgical History  Procedure Laterality Date  . Dilation and curettage of uterus  11/2008   Willow Ora, MD Ct Abdomen Pelvis W Contrast  11/10/2013   CLINICAL DATA:  Ventral hernia, cholelithiasis, right abdominal pain and nausea, obesity  EXAM: CT ABDOMEN AND PELVIS WITH CONTRAST  TECHNIQUE: Multidetector CT imaging of the abdomen and pelvis was performed using the standard protocol following bolus administration of intravenous contrast.  CONTRAST:  OMNIPAQUE IOHEXOL 300 MG/ML  SOLN  COMPARISON:  11/21/2012  FINDINGS: Minor lingula and right base atelectasis versus scarring. Normal heart size. Enhancing esophageal varices noted about the distal esophagus.  Abdomen: Nodular hepatic surface compatible with hepatic cirrhosis. No biliary dilatation. Portal vein is patent. Right upper quadrant perihepatic ascites noted. Ascites extends along the gallbladder fossa. Difficult to exclude cholecystitis by CT. Pancreas is normal in appearance. Spleen is enlarged. Splenic vein remains patent. Normal adrenal glands and kidneys. No renal obstruction or hydronephrosis. Small amount of free fluid extends along the pericolic gutters bilaterally.  Negative for aneurysm or acute vascular process.  No bowel obstruction, significant dilatation, or ileus. No  free air.  Midline lower abdominal wall ventral hernia noted. Hernia defect measures 6.8 cm, image 69. There is herniation of small bowel and mesentery within the defect. No evidence of incarceration or strangulation. Small amount of free fluid in the hernia defect. Lower abdominal wall at the level of the hernia demonstrates increased skin thickening and diffuse subcutaneous strandy edema compatible with worsening panniculitis or cellulitis.  Pelvis: No pelvic free fluid, fluid collection, hemorrhage, abscess, adenopathy, inguinal abnormality, or inguinal hernia. Urinary bladder unremarkable. Uterus normal in size. Stable adnexae.  No acute osseous findings.  IMPRESSION: Hepatic cirrhosis with associated esophageal varices, splenomegaly, and right upper quadrant perihepatic ascites.  Gallbladder fossa fluid versus pericholecystic fluid. Difficult to exclude cholecystitis.  No biliary dilatation.  Lower abdominal wall midline ventral hernia containing small bowel and mesenteric fat as before.  Increased lower abdominal wall skin thickening and subcutaneous strandy edema, nonspecific by CT but consistent with panniculitis or cellulitis.   Electronically Signed   By: Ruel Favors M.D.   On: 11/10/2013 16:29    I reviewed and copy of the above CT report for her and her husband. I am concerned that she is a child's C. Cirrhotic with ascites and portal hypertension and esophageal varices and her risk of surgery at this time are considerable. I raised the question about an interventional radiology transvenous transhepatic shunt to lower her portal pressures and make her risk more reasonable. The other question I ask her was to whether she had a discussion about her candidacy for a liver transplant. I have asked her to go discuss these with Dr. Randa Evens and I will call him about these as well.  At  the present time we will not be scheduling any surgery in light of these findings Matt B. Daphine Deutscher, MD, [facility] [phone] beeper [phone]  11/11/2013 5:02 PM

## 2013-11-16 NOTE — Telephone Encounter (Signed)
Pt saw surgeon on 11/12.  Per his note, pt not a candidate for surgery at this time.  Will continue Coumadin without interruption at this time.

## 2013-11-27 ENCOUNTER — Ambulatory Visit (INDEPENDENT_AMBULATORY_CARE_PROVIDER_SITE_OTHER): Payer: BC Managed Care – PPO | Admitting: Pharmacist

## 2013-11-27 DIAGNOSIS — I82409 Acute embolism and thrombosis of unspecified deep veins of unspecified lower extremity: Secondary | ICD-10-CM

## 2013-11-27 DIAGNOSIS — Z7901 Long term (current) use of anticoagulants: Secondary | ICD-10-CM

## 2013-11-27 MED ORDER — WARFARIN SODIUM 5 MG PO TABS: ORAL_TABLET | ORAL | Status: DC

## 2013-12-08 ENCOUNTER — Telehealth: Payer: Self-pay | Admitting: Internal Medicine

## 2013-12-08 MED ORDER — ENOXAPARIN SODIUM 100 MG/ML ~~LOC~~ SOLN: 100.0000 mg | Freq: Two times a day (BID) | SUBCUTANEOUS | Status: DC

## 2013-12-08 NOTE — Telephone Encounter (Signed)
Please advise 

## 2013-12-08 NOTE — Telephone Encounter (Signed)
Patient called and states that she is having her liver biopsy soon at [facility]. A nurse from [facility] called her for pre op clearance confused about who will be giving the patient her Lovenox injections.

## 2013-12-08 NOTE — Telephone Encounter (Signed)
Patient to have a liver biopsy 12/10/2013. She already stopped Coumadin and needs to know how to use Lovenox. She weights 112 kg Plan: Start Lovenox 100 mg twice a day as soon as the doctor who does the liver biopsy tells her is safe to do so (I don't know). At the same time she start lovenox, also restart  Coumadin Needs to go to the Coumadin clinic 5 days after   Lovenox and Coumadin restarted for further instructions. Patient verbalized understanding, rx for  Lovenox sent  Will CC coumadin clinic Audrie Lia)

## 2013-12-08 NOTE — Telephone Encounter (Signed)
Patient is having her biopsy in the morning and needs an answer to her question below this evening.

## 2013-12-18 ENCOUNTER — Ambulatory Visit (INDEPENDENT_AMBULATORY_CARE_PROVIDER_SITE_OTHER): Payer: BC Managed Care – PPO | Admitting: *Deleted

## 2013-12-18 DIAGNOSIS — Z7901 Long term (current) use of anticoagulants: Secondary | ICD-10-CM

## 2013-12-18 DIAGNOSIS — I82409 Acute embolism and thrombosis of unspecified deep veins of unspecified lower extremity: Secondary | ICD-10-CM

## 2013-12-18 LAB — POCT INR: INR: 2

## 2013-12-23 ENCOUNTER — Encounter (INDEPENDENT_AMBULATORY_CARE_PROVIDER_SITE_OTHER): Payer: Self-pay

## 2014-01-07 ENCOUNTER — Telehealth: Payer: Self-pay | Admitting: Internal Medicine

## 2014-01-07 DIAGNOSIS — Z7901 Long term (current) use of anticoagulants: Secondary | ICD-10-CM

## 2014-01-07 MED ORDER — ENOXAPARIN SODIUM 100 MG/ML ~~LOC~~ SOLN: 100.0000 mg | Freq: Two times a day (BID) | SUBCUTANEOUS | Status: DC

## 2014-01-07 NOTE — Telephone Encounter (Signed)
If the doctor doing the procedure recommend to stop Coumadin, then stop it . Restart lovenox and Coumadin as soon as the doctor doing the procedure tells her to do so.  Instructions to restart medications: Start Lovenox 100 mg twice a day At the same time she start lovenox, also restart Coumadin  Needs to go to the Coumadin clinic 5 days after Lovenox and Coumadin restarted for further instructions.  Call in a refill for Lovenox.  If further instructions are needed, scheduled office visit

## 2014-01-07 NOTE — Telephone Encounter (Signed)
Patient states that she has another endoscopy scheduled for next Tuesday and she has 3 Lovenox injections left but wants to know if Dr. Larose Kells would like to rx more or what she needs to do. Patient is very upset that she left this message on the triage VM on Monday and has not heard back from anyone. Please advise.

## 2014-01-07 NOTE — Addendum Note (Signed)
Addended by: Reino Bellis on: 01/07/2014 02:25 PM   Modules accepted: Orders, Medications

## 2014-01-07 NOTE — Telephone Encounter (Signed)
Enacted order per Dr Larose Kells. Refilled lovenox.

## 2014-01-20 ENCOUNTER — Ambulatory Visit (INDEPENDENT_AMBULATORY_CARE_PROVIDER_SITE_OTHER): Payer: BC Managed Care – PPO | Admitting: *Deleted

## 2014-01-20 ENCOUNTER — Ambulatory Visit (INDEPENDENT_AMBULATORY_CARE_PROVIDER_SITE_OTHER): Payer: BC Managed Care – PPO | Admitting: Internal Medicine

## 2014-01-20 ENCOUNTER — Encounter: Payer: Self-pay | Admitting: Internal Medicine

## 2014-01-20 VITALS — BP 120/86 | HR 90 | Temp 98.3°F | Wt 247.0 lb

## 2014-01-20 DIAGNOSIS — F329 Major depressive disorder, single episode, unspecified: Secondary | ICD-10-CM

## 2014-01-20 DIAGNOSIS — F3289 Other specified depressive episodes: Secondary | ICD-10-CM

## 2014-01-20 DIAGNOSIS — R42 Dizziness and giddiness: Secondary | ICD-10-CM

## 2014-01-20 DIAGNOSIS — K754 Autoimmune hepatitis: Secondary | ICD-10-CM

## 2014-01-20 DIAGNOSIS — I82409 Acute embolism and thrombosis of unspecified deep veins of unspecified lower extremity: Secondary | ICD-10-CM

## 2014-01-20 DIAGNOSIS — F32A Depression, unspecified: Secondary | ICD-10-CM

## 2014-01-20 DIAGNOSIS — Z0271 Encounter for disability determination: Secondary | ICD-10-CM

## 2014-01-20 DIAGNOSIS — Z7901 Long term (current) use of anticoagulants: Secondary | ICD-10-CM

## 2014-01-20 LAB — POCT INR: INR: 1.5

## 2014-01-20 NOTE — Progress Notes (Signed)
   Subjective:    Patient ID: Margaret [name], female    DOB: 1975-04-17, 39 y.o.   MRN: [id number]  HPI Acute visit, discussed the following issues 2 months history of on and off dizziness described as lightheadedness, denies spinning. Symptoms can be at rest, usually not related to hip motion, sometimes related to turning in bed. Dizziness last 1 minute and then it passed, no associated nausea. When she is dizzy she feels that she can walk straight. Symptoms are more frequent and intense, sometimes 5 or 6 episodes a day.  Also, reports she discontinue Lexapro and Wellbutrin approximately 10 weeks ago he went to see assessment and plan.  Disability?   Past Medical History  Diagnosis Date  . DVT (deep venous thrombosis) 1996, 2000    h/o  . GERD (gastroesophageal reflux disease)   . Obesity, morbid   . Anticardiolipin antibody syndrome   . Hepatitis, autoimmune   . Abnormal menses   . Sleep apnea      mild to moderate  per apnea link y 3-12  . Chronic headaches   . Chronic lower back pain     Dr. Vira Blanco   Past Surgical History  Procedure Laterality Date  . Dilation and curettage of uterus  11/2008   History   Social History  . Marital Status: Married    Spouse Name: N/A    Number of Children: 1  . 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 Smoking status: Former Smoker -- 0.10 packs/day    Types: Cigarettes    Start date: 12/31/1996  . Smokeless tobacco: Never Used     Comment: Quit tobacco 2010  . Alcohol Use: Yes     Comment: occasionally-3 drinks per weekend  . Drug Use: No  . Sexual Activity: Not on file   Other Topics Concern  . Not on file   Social History Narrative  . No narrative on file     Review of Systems Denies any head injury he recently, she has chronic headaches, slightly more noticeable lately?. Denies slurred speech, facial numbness, focal weaknesses, diplopia?. Denies chest pain  or palpitations. Having extensive eval at [facility] for liver disease, reports no anemia after extensive workup     Objective:   Physical Exam BP 120/86  Pulse 90  Temp(Src) 98.3 F (36.8 C)  Wt 247 lb (112.038 kg)  SpO2 98% General -- alert, well-developed, NAD.  Neck--normal carotid pulses HEENT-- Not pale.  Lungs -- normal respiratory effort, no intercostal retractions, no accessory muscle use, and normal breath sounds.  Heart-- normal rate, regular rhythm, no murmur.  Extremities-- no pretibial edema bilaterally  Neurologic--  alert & oriented X3. Speech normal, gait normal, strength normal in all extremities.  DTRs symmetric , EOMI, PERLA  . [place] (-) Psych-- Cognition and judgment appear intact. Cooperative with normal attention span and concentration. No anxious or depressed appearing.      Assessment & Plan:

## 2014-01-20 NOTE — Assessment & Plan Note (Signed)
Now under the care of doctors at [facility], status post a liver biopsy recently and a EGD. Extensive blood work was done. ---- Dr Claybon Jabs, Dr Tiffany Kocher  [facility] Phone [phone] Fax  [phone]

## 2014-01-20 NOTE — Progress Notes (Signed)
Pre visit review using our clinic review tool, if applicable. No additional management support is needed unless otherwise documented below in the visit note. 

## 2014-01-20 NOTE — Assessment & Plan Note (Addendum)
Few months ago (November?) She decided to stop gradually Wellbutrin and Lexapro. Her symptoms actually did not improve last year even after we increased the Lexapro dose and later on added Wellbutrin. Without medications she feels about the same emotionally. Plan: Observation for now

## 2014-01-20 NOTE — Assessment & Plan Note (Addendum)
Presents with dizziness for the last 2 months, on clinical grounds is hard to say if this is a central or peripheral issue. She has an anticardiolipin antibody syndrome, anticoagulated. No carotid disease on exam. Symptoms the started after she stopped Lexapro and Wellbutrin. Plan: MRI of the brain, if not better we'll need to see neurology. She will call me if symptoms increase.

## 2014-01-20 NOTE — Assessment & Plan Note (Signed)
States that she likes to apply for disability on the basis of liver disease, doesn't feel fit to work. If that is the case, I need some documentation from her liver doctors about disability.

## 2014-01-20 NOTE — Patient Instructions (Signed)
Please call anytime if dizzines gets worse  Will schedule a brain MRI

## 2014-01-25 ENCOUNTER — Ambulatory Visit (INDEPENDENT_AMBULATORY_CARE_PROVIDER_SITE_OTHER): Payer: BC Managed Care – PPO | Admitting: *Deleted

## 2014-01-25 DIAGNOSIS — I82409 Acute embolism and thrombosis of unspecified deep veins of unspecified lower extremity: Secondary | ICD-10-CM

## 2014-01-25 DIAGNOSIS — Z7901 Long term (current) use of anticoagulants: Secondary | ICD-10-CM

## 2014-01-25 LAB — POCT INR: INR: 1.5

## 2014-01-28 ENCOUNTER — Telehealth: Payer: Self-pay | Admitting: Internal Medicine

## 2014-01-28 NOTE — Telephone Encounter (Signed)
Patient states that there other symptoms when she gets the dizziness. She experienced severe headache immediately after she gets dizzy. She states she is nauseous and her vision gets blurry. She wanted to let you know. I just received authorization for MRI and pt will go on Saturday to [place].

## 2014-01-28 NOTE — Telephone Encounter (Signed)
Advise pt: ER if dizziness - Headache  Severe otherwise will wait for the MRI

## 2014-01-29 ENCOUNTER — Ambulatory Visit (INDEPENDENT_AMBULATORY_CARE_PROVIDER_SITE_OTHER): Payer: BC Managed Care – PPO

## 2014-01-29 DIAGNOSIS — Z5181 Encounter for therapeutic drug level monitoring: Secondary | ICD-10-CM | POA: Insufficient documentation

## 2014-01-29 DIAGNOSIS — Z7901 Long term (current) use of anticoagulants: Secondary | ICD-10-CM

## 2014-01-29 DIAGNOSIS — I82409 Acute embolism and thrombosis of unspecified deep veins of unspecified lower extremity: Secondary | ICD-10-CM

## 2014-01-29 LAB — POCT INR: INR: 2.1

## 2014-01-29 NOTE — Telephone Encounter (Signed)
Pt still has headaches / dizziness , will go to emergency room on Saturday.

## 2014-01-30 ENCOUNTER — Ambulatory Visit (HOSPITAL_BASED_OUTPATIENT_CLINIC_OR_DEPARTMENT_OTHER)
Admission: RE | Admit: 2014-01-30 | Discharge: 2014-01-30 | Disposition: A | Discharge status: Home / Self Care | Payer: BC Managed Care – PPO | Source: Ambulatory Visit | Attending: Internal Medicine | Admitting: Internal Medicine

## 2014-01-30 DIAGNOSIS — R42 Dizziness and giddiness: Secondary | ICD-10-CM | POA: Insufficient documentation

## 2014-01-30 DIAGNOSIS — R51 Headache: Secondary | ICD-10-CM | POA: Insufficient documentation

## 2014-02-01 ENCOUNTER — Telehealth: Payer: Self-pay | Admitting: *Deleted

## 2014-02-01 NOTE — Telephone Encounter (Addendum)
If she's getting better, symptoms could have been related to stopping Lexapro and Wellbutrin. We'll see how she does in the next few weeks, no need to see neurology now

## 2014-02-01 NOTE — Telephone Encounter (Signed)
Pt notified of mri results- pt currently does not have headaches but still gets sporadic dizziness at times. Pt states does not want to be referred to neurology until she talks to her husband pt feels frustrated because nobody knows whats going on with her and is costing too much money.

## 2014-03-04 ENCOUNTER — Ambulatory Visit (INDEPENDENT_AMBULATORY_CARE_PROVIDER_SITE_OTHER): Payer: BC Managed Care – PPO

## 2014-03-04 DIAGNOSIS — I82409 Acute embolism and thrombosis of unspecified deep veins of unspecified lower extremity: Secondary | ICD-10-CM

## 2014-03-04 DIAGNOSIS — Z7901 Long term (current) use of anticoagulants: Secondary | ICD-10-CM

## 2014-03-04 DIAGNOSIS — Z5181 Encounter for therapeutic drug level monitoring: Secondary | ICD-10-CM

## 2014-03-04 LAB — POCT INR: INR: 1.9

## 2014-03-05 ENCOUNTER — Other Ambulatory Visit: Payer: Self-pay | Admitting: Internal Medicine

## 2014-04-08 ENCOUNTER — Other Ambulatory Visit: Payer: Self-pay | Admitting: Internal Medicine

## 2014-04-12 ENCOUNTER — Ambulatory Visit (INDEPENDENT_AMBULATORY_CARE_PROVIDER_SITE_OTHER): Payer: BC Managed Care – PPO

## 2014-04-12 DIAGNOSIS — Z5181 Encounter for therapeutic drug level monitoring: Secondary | ICD-10-CM

## 2014-04-12 DIAGNOSIS — I82409 Acute embolism and thrombosis of unspecified deep veins of unspecified lower extremity: Secondary | ICD-10-CM

## 2014-04-12 DIAGNOSIS — Z7901 Long term (current) use of anticoagulants: Secondary | ICD-10-CM

## 2014-04-12 LAB — POCT INR: INR: 4.3

## 2014-04-26 ENCOUNTER — Ambulatory Visit (INDEPENDENT_AMBULATORY_CARE_PROVIDER_SITE_OTHER): Payer: BC Managed Care – PPO

## 2014-04-26 DIAGNOSIS — I82409 Acute embolism and thrombosis of unspecified deep veins of unspecified lower extremity: Secondary | ICD-10-CM

## 2014-04-26 DIAGNOSIS — Z7901 Long term (current) use of anticoagulants: Secondary | ICD-10-CM

## 2014-04-26 DIAGNOSIS — Z5181 Encounter for therapeutic drug level monitoring: Secondary | ICD-10-CM

## 2014-04-26 LAB — POCT INR: INR: 1.7

## 2014-05-17 ENCOUNTER — Ambulatory Visit (INDEPENDENT_AMBULATORY_CARE_PROVIDER_SITE_OTHER): Payer: BC Managed Care – PPO | Admitting: *Deleted

## 2014-05-17 DIAGNOSIS — Z7901 Long term (current) use of anticoagulants: Secondary | ICD-10-CM

## 2014-05-17 DIAGNOSIS — Z5181 Encounter for therapeutic drug level monitoring: Secondary | ICD-10-CM

## 2014-05-17 DIAGNOSIS — I82409 Acute embolism and thrombosis of unspecified deep veins of unspecified lower extremity: Secondary | ICD-10-CM

## 2014-05-17 LAB — POCT INR: INR: 1.7

## 2014-05-17 MED ORDER — WARFARIN SODIUM 5 MG PO TABS: ORAL_TABLET | ORAL | Status: DC

## 2014-05-21 ENCOUNTER — Encounter: Payer: Self-pay | Admitting: General Practice

## 2014-05-21 ENCOUNTER — Telehealth: Payer: Self-pay | Admitting: Internal Medicine

## 2014-05-21 ENCOUNTER — Other Ambulatory Visit: Payer: Self-pay | Admitting: Family Medicine

## 2014-05-21 ENCOUNTER — Encounter: Payer: Self-pay | Admitting: Family Medicine

## 2014-05-21 ENCOUNTER — Ambulatory Visit (INDEPENDENT_AMBULATORY_CARE_PROVIDER_SITE_OTHER): Payer: BC Managed Care – PPO | Admitting: Family Medicine

## 2014-05-21 VITALS — BP 110/80 | HR 87 | Temp 98.2°F | Resp 16 | Wt 224.4 lb

## 2014-05-21 DIAGNOSIS — R55 Syncope and collapse: Secondary | ICD-10-CM | POA: Insufficient documentation

## 2014-05-21 DIAGNOSIS — D696 Thrombocytopenia, unspecified: Secondary | ICD-10-CM

## 2014-05-21 DIAGNOSIS — R040 Epistaxis: Secondary | ICD-10-CM

## 2014-05-21 LAB — BASIC METABOLIC PANEL
BUN: 10 mg/dL (ref 6–23)
CALCIUM: 8.4 mg/dL (ref 8.4–10.5)
CO2: 26 mEq/L (ref 19–32)
Chloride: 106 mEq/L (ref 96–112)
Creatinine, Ser: 0.6 mg/dL (ref 0.4–1.2)
GFR: 112.61 mL/min (ref >60.00)
Glucose, Bld: 83 mg/dL (ref 70–99)
Potassium: 4.1 mEq/L (ref 3.5–5.1)
SODIUM: 135 meq/L (ref 135–145)

## 2014-05-21 LAB — CBC WITH DIFFERENTIAL/PLATELET
Basophils Absolute: 0 10*3/uL (ref 0.0–0.1)
Basophils Relative: 0.5 % (ref 0.0–3.0)
EOS ABS: 0.1 10*3/uL (ref 0.0–0.7)
EOS PCT: 2.1 % (ref 0.0–5.0)
HCT: 35.8 % — ABNORMAL LOW (ref 36.0–46.0)
Hemoglobin: 11.9 g/dL — ABNORMAL LOW (ref 12.0–15.0)
LYMPHS PCT: 20.8 % (ref 12.0–46.0)
Lymphs Abs: 0.7 10*3/uL (ref 0.7–4.0)
MCHC: 33.2 g/dL (ref 30.0–36.0)
MCV: 97.3 fl (ref 78.0–100.0)
Monocytes Absolute: 0.5 10*3/uL (ref 0.1–1.0)
Monocytes Relative: 14.5 % — ABNORMAL HIGH (ref 3.0–12.0)
NEUTROS PCT: 62.1 % (ref 43.0–77.0)
Neutro Abs: 2 10*3/uL (ref 1.4–7.7)
Platelets: 78 10*3/uL — ABNORMAL LOW (ref 150.0–400.0)
RBC: 3.68 Mil/uL — ABNORMAL LOW (ref 3.87–5.11)
RDW: 20.5 % — ABNORMAL HIGH (ref 11.5–15.5)
WBC: 3.2 10*3/uL — AB (ref 4.0–10.5)

## 2014-05-21 LAB — HEMOGLOBIN A1C: HEMOGLOBIN A1C: 4.6 % (ref 4.6–6.5)

## 2014-05-21 LAB — TSH: TSH: 2.13 u[IU]/mL (ref 0.35–4.50)

## 2014-05-21 NOTE — Patient Instructions (Signed)
This sounds like a vagal episode- your BP dropped to low when changing from lying to standing Increase your water intake Change positions slowly to allow yourself time to adjust Add salt to vegetables- this will keep BP steady We'll notify you of your lab results and make any changes if needed Call with any questions or concerns Hang in there!!

## 2014-05-21 NOTE — Telephone Encounter (Signed)
Patient Information:  Caller Name: Margaret [name]  Phone: [phone]  Patient: Margaret [name]  Gender: Female  DOB: May 21, 1975  Age: 39 Years  PCP: Kathlene November  Pregnant: No  Office Follow Up:  Does the office need to follow up with this patient?: No  Instructions For The Office: N/A  RN Note:  Care advice and call back parameters review.  Appt scheduled for evaluation today by Dr. Birdie Riddle MD.  A  Symptoms  Reason For Call & Symptoms: Patient reports that yesterday she set her phone alarm to pick up her child from school.  She states the next thing she remembered was her husband calling her asking where she was ? school was calling him about picking child up.  She states she awoke on the floor.  She states she must have passed out and her nose was bleeding.  She felt disoriented , shakey and abdominal pain. Episode lasted twenty minutes.  Today, feels shakey inside.  Voice is calm and clear . [id number].  she reports a history of  auto immune Hepatits  Reviewed Health History In EMR: Yes  Reviewed Medications In EMR: Yes  Reviewed Allergies In EMR: Yes  Reviewed Surgeries / Procedures: Yes  Date of Onset of Symptoms: 05/20/2014 OB / GYN:  LMP: Unknown  Guideline(s) Used:  Fainting  Disposition Per Guideline:   See Today in Office  Reason For Disposition Reached:   Patient wants to be seen  Advice Given:  Warning Symptoms for Fainting:  Fainting usually has early warning symptoms (e.g., dizziness, blurred vision, nausea, feeling cold, or warm).  If you feel these warning symptoms, immediately lie down to prevent falling down. You only have 5 seconds to act (Reason: almost impossible to faint when lying down).  Lying down (even on the floor) is less embarrassing than fainting, no matter where you are.  Sitting down (with head between knees) is certainly better than staying standing up but is less effective than lying down.  Call Back If:  You pass out again on the same day  You become  worse.  Patient Will Follow Care Advice:  YES  Appointment Scheduled:  05/21/2014 11:30:00 Appointment Scheduled Provider:  Midge Minium.

## 2014-05-21 NOTE — Telephone Encounter (Signed)
FYI, pt has an appt today

## 2014-05-21 NOTE — Progress Notes (Signed)
   Subjective:    Patient ID: Margaret [name], female    DOB: September 12, 1975, 39 y.o.   MRN: [id number]  HPI Syncope- pt reports she sets a daily alarm to get her kids from school at 2:45.  Was lying down watching TV, got up, 'that's the last I remember'.  Pt got up when the phone rang- was on the floor.  Answered phone at 3:50.  Was 'really shaky', felt like she had to go the bathroom- no diarrhea, regular BM, was flushed.  Nose was bleeding- small trickle, 'blew my nose and it stopped'.  Pt reports earlier in the day was 'really tired'.  Denies HA, vertigo.  Pt had eaten yesterday.  No new or different medications.  No CP, SOB, HAs, visual changes.  + nausea.   Review of Systems For ROS see HPI     Objective:   Physical Exam  Vitals reviewed. Constitutional: She is oriented to person, place, and time. She appears well-developed and well-nourished. No distress.  obese  HENT:  Head: Normocephalic and atraumatic.  No external nasal deformity, scabbed blood present in nostrils bilaterally  Eyes: Conjunctivae and EOM are normal. Pupils are equal, round, and reactive to light.  Neck: Normal range of motion. Neck supple. No thyromegaly present.  Cardiovascular: Normal rate, regular rhythm, normal heart sounds and intact distal pulses.   No murmur heard. Pulmonary/Chest: Effort normal and breath sounds normal. No respiratory distress.  Abdominal: Soft. She exhibits no distension. There is no tenderness.  Musculoskeletal: She exhibits no edema.  Lymphadenopathy:    She has no cervical adenopathy.  Neurological: She is alert and oriented to person, place, and time.  Skin: Skin is warm and dry.  Psychiatric: She has a normal mood and affect. Her behavior is normal.          Assessment & Plan:

## 2014-05-21 NOTE — Assessment & Plan Note (Signed)
New.  Suspect this was due from mild trauma of passing out coupled w/ fact that pt is on coumadin.  Check CBC.  Cautioned pt that if bleeding exceeds 20-30 minutes she needs to seek medical attention.  Pt expressed understanding and is in agreement w/ plan.

## 2014-05-21 NOTE — Assessment & Plan Note (Signed)
New.  Pt is mildly orthostatic in office today and sxs consistent w/ vagal episode.  Will check labs to r/o metabolic cause.  Encouraged pt to increase fluids, change positions slowly, add salt to diet.  EKG w/o obvious cause.  If pt has recurrent sxs, will need cards and/or neuro w/u.  Reviewed supportive care and red flags that should prompt return.  Pt expressed understanding and is in agreement w/ plan.

## 2014-05-21 NOTE — Progress Notes (Signed)
Pre visit review using our clinic review tool, if applicable. No additional management support is needed unless otherwise documented below in the visit note. 

## 2014-05-28 ENCOUNTER — Other Ambulatory Visit: Payer: Self-pay | Admitting: Family Medicine

## 2014-05-28 ENCOUNTER — Other Ambulatory Visit (INDEPENDENT_AMBULATORY_CARE_PROVIDER_SITE_OTHER): Payer: BC Managed Care – PPO

## 2014-05-28 DIAGNOSIS — D696 Thrombocytopenia, unspecified: Secondary | ICD-10-CM

## 2014-05-28 DIAGNOSIS — R7989 Other specified abnormal findings of blood chemistry: Secondary | ICD-10-CM

## 2014-05-28 DIAGNOSIS — Z7901 Long term (current) use of anticoagulants: Secondary | ICD-10-CM

## 2014-05-28 DIAGNOSIS — I82409 Acute embolism and thrombosis of unspecified deep veins of unspecified lower extremity: Secondary | ICD-10-CM

## 2014-05-28 LAB — CBC WITH DIFFERENTIAL/PLATELET
BASOS PCT: 0.7 % (ref 0.0–3.0)
Basophils Absolute: 0 10*3/uL (ref 0.0–0.1)
EOS PCT: 2.4 % (ref 0.0–5.0)
Eosinophils Absolute: 0.1 10*3/uL (ref 0.0–0.7)
HEMATOCRIT: 35.5 % — AB (ref 36.0–46.0)
Hemoglobin: 11.8 g/dL — ABNORMAL LOW (ref 12.0–15.0)
Lymphocytes Relative: 19.2 % (ref 12.0–46.0)
Lymphs Abs: 0.6 10*3/uL — ABNORMAL LOW (ref 0.7–4.0)
MCHC: 33.3 g/dL (ref 30.0–36.0)
MCV: 97.5 fl (ref 78.0–100.0)
MONO ABS: 0.2 10*3/uL (ref 0.1–1.0)
MONOS PCT: 7.7 % (ref 3.0–12.0)
Neutro Abs: 2.2 10*3/uL (ref 1.4–7.7)
Platelets: 86 10*3/uL — ABNORMAL LOW (ref 150.0–400.0)
RBC: 3.64 Mil/uL — AB (ref 3.87–5.11)
RDW: 20.6 % — ABNORMAL HIGH (ref 11.5–15.5)
WBC: 3.1 10*3/uL — AB (ref 4.0–10.5)

## 2014-06-01 ENCOUNTER — Ambulatory Visit (INDEPENDENT_AMBULATORY_CARE_PROVIDER_SITE_OTHER): Payer: BC Managed Care – PPO

## 2014-06-01 DIAGNOSIS — Z5181 Encounter for therapeutic drug level monitoring: Secondary | ICD-10-CM

## 2014-06-01 DIAGNOSIS — I82409 Acute embolism and thrombosis of unspecified deep veins of unspecified lower extremity: Secondary | ICD-10-CM

## 2014-06-01 LAB — POCT INR: INR: 2

## 2014-06-04 ENCOUNTER — Other Ambulatory Visit (INDEPENDENT_AMBULATORY_CARE_PROVIDER_SITE_OTHER): Payer: BC Managed Care – PPO

## 2014-06-04 ENCOUNTER — Other Ambulatory Visit: Payer: Self-pay | Admitting: Family Medicine

## 2014-06-04 DIAGNOSIS — D696 Thrombocytopenia, unspecified: Secondary | ICD-10-CM

## 2014-06-04 DIAGNOSIS — D473 Essential (hemorrhagic) thrombocythemia: Secondary | ICD-10-CM

## 2014-06-04 DIAGNOSIS — R7989 Other specified abnormal findings of blood chemistry: Secondary | ICD-10-CM

## 2014-06-04 LAB — CBC WITH DIFFERENTIAL/PLATELET
Basophils Absolute: 0 10*3/uL (ref 0.0–0.1)
Basophils Relative: 0.5 % (ref 0.0–3.0)
EOS ABS: 0.1 10*3/uL (ref 0.0–0.7)
Eosinophils Relative: 2 % (ref 0.0–5.0)
HCT: 36.5 % (ref 36.0–46.0)
Hemoglobin: 12.4 g/dL (ref 12.0–15.0)
Lymphocytes Relative: 21.7 % (ref 12.0–46.0)
Lymphs Abs: 0.6 10*3/uL — ABNORMAL LOW (ref 0.7–4.0)
MCHC: 34 g/dL (ref 30.0–36.0)
MCV: 95.3 fl (ref 78.0–100.0)
Monocytes Absolute: 0.4 10*3/uL (ref 0.1–1.0)
Monocytes Relative: 12.8 % — ABNORMAL HIGH (ref 3.0–12.0)
NEUTROS ABS: 1.8 10*3/uL (ref 1.4–7.7)
NEUTROS PCT: 63 % (ref 43.0–77.0)
Platelets: 80 10*3/uL — ABNORMAL LOW (ref 150.0–400.0)
RBC: 3.84 Mil/uL — ABNORMAL LOW (ref 3.87–5.11)
RDW: 19.3 % — ABNORMAL HIGH (ref 11.5–15.5)
WBC: 2.9 10*3/uL — ABNORMAL LOW (ref 4.0–10.5)

## 2014-06-28 ENCOUNTER — Telehealth: Payer: Self-pay | Admitting: Hematology & Oncology

## 2014-06-28 NOTE — Telephone Encounter (Signed)
Spoke w NEW PATIENT today to remind them of their appointment with Dr. Ennever. Also, advised them to bring all medication bottles and insurance card information. ° °

## 2014-06-29 ENCOUNTER — Ambulatory Visit (INDEPENDENT_AMBULATORY_CARE_PROVIDER_SITE_OTHER): Payer: BC Managed Care – PPO | Admitting: Pharmacist Clinician (PhC)/ Clinical Pharmacy Specialist

## 2014-06-29 ENCOUNTER — Ambulatory Visit: Payer: BC Managed Care – PPO

## 2014-06-29 ENCOUNTER — Encounter: Payer: Self-pay | Admitting: Hematology & Oncology

## 2014-06-29 ENCOUNTER — Ambulatory Visit (HOSPITAL_BASED_OUTPATIENT_CLINIC_OR_DEPARTMENT_OTHER): Payer: BC Managed Care – PPO | Admitting: Hematology & Oncology

## 2014-06-29 ENCOUNTER — Other Ambulatory Visit (HOSPITAL_BASED_OUTPATIENT_CLINIC_OR_DEPARTMENT_OTHER): Payer: BC Managed Care – PPO | Admitting: Lab

## 2014-06-29 VITALS — BP 115/73 | HR 89 | Temp 97.8°F | Resp 14 | Ht 59.0 in | Wt 246.0 lb

## 2014-06-29 DIAGNOSIS — Z7901 Long term (current) use of anticoagulants: Secondary | ICD-10-CM

## 2014-06-29 DIAGNOSIS — D696 Thrombocytopenia, unspecified: Secondary | ICD-10-CM

## 2014-06-29 DIAGNOSIS — Z5181 Encounter for therapeutic drug level monitoring: Secondary | ICD-10-CM

## 2014-06-29 DIAGNOSIS — D72819 Decreased white blood cell count, unspecified: Secondary | ICD-10-CM

## 2014-06-29 DIAGNOSIS — I82409 Acute embolism and thrombosis of unspecified deep veins of unspecified lower extremity: Secondary | ICD-10-CM

## 2014-06-29 DIAGNOSIS — Z86718 Personal history of other venous thrombosis and embolism: Secondary | ICD-10-CM

## 2014-06-29 LAB — CBC WITH DIFFERENTIAL (CANCER CENTER ONLY)
BASO#: 0 10*3/uL (ref 0.0–0.2)
BASO%: 1 % (ref 0.0–2.0)
EOS ABS: 0.1 10*3/uL (ref 0.0–0.5)
EOS%: 2.8 % (ref 0.0–7.0)
HCT: 36.6 % (ref 34.8–46.6)
HEMOGLOBIN: 12.1 g/dL (ref 11.6–15.9)
LYMPH#: 0.6 10*3/uL — ABNORMAL LOW (ref 0.9–3.3)
LYMPH%: 20.5 % (ref 14.0–48.0)
MCH: 33 pg (ref 26.0–34.0)
MCHC: 33.1 g/dL (ref 32.0–36.0)
MCV: 100 fL (ref 81–101)
MONO#: 0.2 10*3/uL (ref 0.1–0.9)
MONO%: 6.9 % (ref 0.0–13.0)
NEUT#: 2 10*3/uL (ref 1.5–6.5)
NEUT%: 68.8 % (ref 39.6–80.0)
Platelets: 69 10*3/uL — ABNORMAL LOW (ref 145–400)
RBC: 3.67 10*6/uL — AB (ref 3.70–5.32)
RDW: 18.7 % — ABNORMAL HIGH (ref 11.1–15.7)
WBC: 2.9 10*3/uL — ABNORMAL LOW (ref 3.9–10.0)

## 2014-06-29 LAB — CHCC SATELLITE - SMEAR

## 2014-06-29 LAB — POCT INR: INR: 1.9

## 2014-06-29 LAB — TECHNOLOGIST REVIEW CHCC SATELLITE

## 2014-06-30 ENCOUNTER — Telehealth: Payer: Self-pay | Admitting: Hematology & Oncology

## 2014-06-30 LAB — LUPUS ANTICOAGULANT PANEL
DRVVT 1 1 MIX: 40.6 s (ref <42.9)
DRVVT: 45.6 s — AB (ref <42.9)
Lupus Anticoagulant: NOT DETECTED
PTT Lupus Anticoagulant: 42 secs (ref 28.0–43.0)

## 2014-06-30 LAB — CARDIOLIPIN ANTIBODIES, IGG, IGM, IGA
Anticardiolipin IgA: 6 APL U/mL (ref <22)
Anticardiolipin IgG: 16 GPL U/mL (ref <23)
Anticardiolipin IgM: 18 MPL U/mL — ABNORMAL HIGH (ref <11)

## 2014-06-30 NOTE — Telephone Encounter (Signed)
Pt aware of 9-1

## 2014-06-30 NOTE — Progress Notes (Signed)
Referral MD  Reason for Referral: Leukopenia and thrombocytopenia    Multiple thromboembolic episodes    Autoimmune hepatitis    Splenomegaly   Chief Complaint  Patient presents with  . NEW PATIENT  : I'm here to find out what is wrong with me.  HPI: Margaret [name] is a very nice 39 year old Margaret [name] female. She is adopted.  She went to the emergency room a couple weeks ago. She apparently has some type of syncopal episode. When she had lab work done, she is not having Margaret [name] cell count 2.9. Hemoglobin 12.4. Platelet count was 80,000. She had a relatively normal Margaret [name] cell differential.  Back in early May, her Margaret [name] cell count 3.2. He will 11.9. Platelet count 78,000.  Go back to 2012, her blood counts were normal at that time.  She has an interesting past medical history. . She's had multiple blood clots. These were in her legs. She's had 4 episodes of these. She has not had one now for 6 years. 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 The only labs I see or from 5 years ago. Her anti-Cardiolipin antibodies were normal. She had a negative lupus anticoagulant. She did have low protein C and protein S levels. She is on chronic Coumadin. Her anti-thrombin 3 level was low.  She's not had bleeding. She has had no cough. She has had some leg swelling. She has had no cardiac issues.  She has been considered for a gastric bypass but apparently is too risky for what she says because of her blood issues.  We are asked to see her for the evaluation of her Margaret [name] cells and platelets.  She had an abdominal ultrasound back in October 2014. Her spleen was enlarged. Liver showed no focal lesions. A CT scan of the abdomen done back in November showed a nodular liver consistent with cirrhosis. There was some ascites. Again there was splenic enlargement. There was a large ventral wall hernia.  She's had no fever. She's had no palpable lymph nodes. There's been no obvious change in bowel or bladder  habits.             Past Medical History  Diagnosis Date  . DVT (deep venous thrombosis) 1996, 2000    h/o  . GERD (gastroesophageal reflux disease)   . Obesity, morbid   . Anticardiolipin antibody syndrome   . Hepatitis, autoimmune   . Abnormal menses   . Sleep apnea      mild to moderate  per apnea link y 3-12  . Chronic headaches   . Chronic lower back pain     Margaret [name]  :  Past Surgical History  Procedure Laterality Date  . Dilation and curettage of uterus  11/2008  :  Current outpatient prescriptions:azaTHIOprine (IMURAN) 50 MG tablet, Take 50 mg by mouth daily. Takes 4 tablets daily., Disp: , Rfl: ;  budesonide (ENTOCORT EC) 3 MG 24 hr capsule, Take 3 mg by mouth daily. Takes 3 tabs q AM = 9 mg, Disp: , Rfl: ;  chlorzoxazone (PARAFON) 500 MG tablet, Take by mouth 4 (four) times daily as needed for muscle spasms., Disp: , Rfl: ;  omeprazole (PRILOSEC) 20 MG capsule, Take 20 mg by mouth as needed.  , Disp: , Rfl:  Oxycodone HCl 10 MG TABS, Take 10 mg by mouth 4 (four) times daily as needed. Pt takes 1 1/2 tabs  As needed, Disp: , Rfl: ;  tapentadol (NUCYNTA) 50 MG TABS tablet, Take 50 mg by mouth 4 (  four) times daily. , Disp: , Rfl: ;  warfarin (COUMADIN) 5 MG tablet, 7.5 mg daily, $RemoveB'8mg'HhNpvOEZ$  on wednesdays., Disp: , Rfl: :  :  Allergies  Allergen Reactions  . Influenza Vaccines     "Got sick x months"  :  Family History  Problem Relation Age of Onset  . Adopted: Yes  :  History   Social History  . Marital Status: Married    Spouse Name: N/A    Number of Children: 1  . 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 Smoking status: Former Smoker -- 0.25 packs/day for 16 years    Types: Cigarettes    Start date: 12/31/1996    Quit date: 06/29/2012  . Smokeless tobacco: Never Used     Comment: QUIT SMOKING 1.5 YEARS AGO  . Alcohol Use: Yes     Comment: occasionally-3 drinks per weekend  . Drug Use: Not  on file  . Sexual Activity: Not on file   Other Topics Concern  . Not on file   Social History Narrative  . No narrative on file  :  Pertinent items are noted in HPI.  Exam: $Remo'@IPVITALS'AhXtq$ @  obese Notz female in no obvious distress. Vital signs show temperature of 97.8. Pulse 89. Blood pressure 115/73. Weight 246 pounds. Head and neck exam shows no ocular or oral lesions. Shows no scleral icterus. She has no adenopathy in the neck. Lungs are clear. Cardiac exam regular in rhythm. Abdomen soft. She is morbidly obese. Has good bowel sounds.  There is no palpable liver or spleen tip. There may be a fluid wave. Back exam no tenderness over the spine ribs or hips. Extremities shows no clubbing cyanosis or edema. There is some chronic stasis dermatitis type changes in the lower legs. Strength is good bilaterally. She is decent joint range of motion. Skin exam shows some scattered ecchymoses. No petechia are noted.    Recent Labs  06/29/14 1149  WBC 2.9*  HGB 12.1  HCT 36.6  PLT 69*   No results found for this basename: NA, K, CL, CO2, GLUCOSE, BUN, CREATININE, CALCIUM,  in the last 72 hours  Blood smear review: Normochromic and normocytic red blood cells. There are no schistocytes or spherocytes. I see no target cells. She has no rouleau formation. There is no nucleated red cells. Margaret [name] cells are slightly decreased in number. There are no hypersegmented polys. There are no immature myeloid cells. I see no atypical lymphocytes. Toes are decreased in number. Platelets are low granulated. Platelets are small in size although there may be a couple large platelets.   Pathology: no data     Assessment and Plan:  Ms. Margaret [name] is a 39 year old female with leukopenia thrombocytopenia. I have to believe that this is going to be from her cirrhosis and splenomegaly. The thrombocytopenia may be anti-Cardiolipin antibodies but I don't see any evidence that she has this.  I think that as long as she has the  cirrhosis and splenomegaly, her blood counts will be on the low side.  I rule out that she is on Coumadin. I would don't see a problem for been on Coumadin as this is not affect platelet function so she really should be okay with taking Coumadin. My only problem her Coumadin as her hepatic function. The Coumadin certainly it is not have to be to therapeutic in no acute the INR about 2. She has not had  a blood clot in 6 years so this is encouraging.  I do not see need for any bone marrow biopsy that needs to be done.  I would check a protein C and protein S levels but unfortunately she is on Coumadin and we cannot check these.  I probably will check it anti-thrombin 3 level. I find it interesting that this is low.  I will plan to get her back probably in 6 weeks or so.  I spent a good hour with her. I showed her the lab work. I went over my recommendations. She is very nice.  We will continue pray hard for her. I did give her a prayer blanket which she was very happy about.

## 2014-07-27 ENCOUNTER — Ambulatory Visit (INDEPENDENT_AMBULATORY_CARE_PROVIDER_SITE_OTHER): Payer: BC Managed Care – PPO | Admitting: Pharmacist

## 2014-07-27 DIAGNOSIS — Z7901 Long term (current) use of anticoagulants: Secondary | ICD-10-CM

## 2014-07-27 DIAGNOSIS — I82409 Acute embolism and thrombosis of unspecified deep veins of unspecified lower extremity: Secondary | ICD-10-CM

## 2014-07-27 DIAGNOSIS — Z5181 Encounter for therapeutic drug level monitoring: Secondary | ICD-10-CM

## 2014-07-27 LAB — POCT INR: INR: 3.4

## 2014-08-10 ENCOUNTER — Ambulatory Visit (INDEPENDENT_AMBULATORY_CARE_PROVIDER_SITE_OTHER): Payer: BC Managed Care – PPO | Admitting: Pharmacist Clinician (PhC)/ Clinical Pharmacy Specialist

## 2014-08-10 DIAGNOSIS — I82409 Acute embolism and thrombosis of unspecified deep veins of unspecified lower extremity: Secondary | ICD-10-CM

## 2014-08-10 DIAGNOSIS — Z5181 Encounter for therapeutic drug level monitoring: Secondary | ICD-10-CM

## 2014-08-10 DIAGNOSIS — Z7901 Long term (current) use of anticoagulants: Secondary | ICD-10-CM

## 2014-08-10 LAB — POCT INR: INR: 2.4

## 2014-08-10 MED ORDER — WARFARIN SODIUM 5 MG PO TABS: ORAL_TABLET | ORAL | Status: DC

## 2014-08-31 ENCOUNTER — Ambulatory Visit (INDEPENDENT_AMBULATORY_CARE_PROVIDER_SITE_OTHER): Payer: BC Managed Care – PPO | Admitting: Pharmacist Clinician (PhC)/ Clinical Pharmacy Specialist

## 2014-08-31 ENCOUNTER — Ambulatory Visit (HOSPITAL_BASED_OUTPATIENT_CLINIC_OR_DEPARTMENT_OTHER): Payer: BC Managed Care – PPO | Admitting: Family

## 2014-08-31 ENCOUNTER — Encounter: Payer: Self-pay | Admitting: Family

## 2014-08-31 ENCOUNTER — Other Ambulatory Visit (HOSPITAL_BASED_OUTPATIENT_CLINIC_OR_DEPARTMENT_OTHER): Payer: BC Managed Care – PPO | Admitting: Lab

## 2014-08-31 VITALS — BP 132/80 | HR 72 | Temp 98.1°F | Resp 16 | Ht <= 58 in | Wt 236.0 lb

## 2014-08-31 DIAGNOSIS — Z7901 Long term (current) use of anticoagulants: Secondary | ICD-10-CM

## 2014-08-31 DIAGNOSIS — Z5181 Encounter for therapeutic drug level monitoring: Secondary | ICD-10-CM

## 2014-08-31 DIAGNOSIS — D696 Thrombocytopenia, unspecified: Secondary | ICD-10-CM

## 2014-08-31 DIAGNOSIS — I82409 Acute embolism and thrombosis of unspecified deep veins of unspecified lower extremity: Secondary | ICD-10-CM

## 2014-08-31 DIAGNOSIS — R161 Splenomegaly, not elsewhere classified: Secondary | ICD-10-CM

## 2014-08-31 DIAGNOSIS — K7460 Unspecified cirrhosis of liver: Secondary | ICD-10-CM

## 2014-08-31 DIAGNOSIS — D72819 Decreased white blood cell count, unspecified: Secondary | ICD-10-CM

## 2014-08-31 LAB — CBC WITH DIFFERENTIAL (CANCER CENTER ONLY)
BASO#: 0 10*3/uL (ref 0.0–0.2)
BASO%: 0.3 % (ref 0.0–2.0)
EOS ABS: 0 10*3/uL (ref 0.0–0.5)
EOS%: 1 % (ref 0.0–7.0)
HEMATOCRIT: 36.3 % (ref 34.8–46.6)
HEMOGLOBIN: 12.2 g/dL (ref 11.6–15.9)
LYMPH#: 0.4 10*3/uL — ABNORMAL LOW (ref 0.9–3.3)
LYMPH%: 13.5 % — AB (ref 14.0–48.0)
MCH: 34.3 pg — AB (ref 26.0–34.0)
MCHC: 33.6 g/dL (ref 32.0–36.0)
MCV: 102 fL — AB (ref 81–101)
MONO#: 0.3 10*3/uL (ref 0.1–0.9)
MONO%: 9.8 % (ref 0.0–13.0)
NEUT#: 2.2 10*3/uL (ref 1.5–6.5)
NEUT%: 75.4 % (ref 39.6–80.0)
PLATELETS: 61 10*3/uL — AB (ref 145–400)
RBC: 3.56 10*6/uL — ABNORMAL LOW (ref 3.70–5.32)
RDW: 20.4 % — ABNORMAL HIGH (ref 11.1–15.7)
WBC: 3 10*3/uL — ABNORMAL LOW (ref 3.9–10.0)

## 2014-08-31 LAB — CHCC SATELLITE - SMEAR

## 2014-08-31 LAB — TECHNOLOGIST REVIEW CHCC SATELLITE

## 2014-08-31 LAB — POCT INR: INR: 5

## 2014-08-31 NOTE — Progress Notes (Signed)
[place]  Telephone:(336) [phone] Fax:([phone]) [phone]  ID: Margaret [name] OB: 1975-05-18 MR#: [id number] JYN#:[id number] Patient Care Team: Colon Branch, MD as PCP - General Rigoberto Noel, MD as Referring Physician (Pulmonary Disease)  DIAGNOSIS: Leukopenia and thrombocytopenia  Multiple thromboembolic episodes  Autoimmune hepatitis  Splenomegaly   INTERVAL HISTORY: Margaret [name] is a very nice 39 year old [place] female.  She went to the emergency room in June after having a syncopal episode. Her labs were relatively normal.   She has had multiple blood clots in her legs. She has had this happen 4 times. It has been 6-7 years since her last episode. She has anti-cardiolipin antibody syndrome. Her labs from a few years ago showed her anti-Cardiolipin antibodies were normal, a negative lupus anticoagulant and she did have low protein C and protein S levels. She is on chronic Coumadin. Her anti-thrombin 3 level was low.  She has had no bleeding. She c/o headaches that cause nausea to the point of vomiting at times. This happens ever 3 weeks or so. She is sore in her side from the vomiting. She has also had some diarrhea. She has lost 10 lbs since her last visit. She says that she is always tired and has no desire to eat. She denies fever, chills, cough, rash, dizziness, SOB, chest pain, palpitations, constipation, blood in urine or stool. She has had no swelling, tenderness, numbness or tingling in her extremities.  She has been considered for a gastric bypass but it was found to be too risky because of her blood issues.  Her WBC today is 61. She had an abdominal ultrasound back in October 2014 where her spleen was enlarged. Liver showed no focal lesions. A CT scan of the abdomen done back in November showed a nodular liver consistent with cirrhosis. There was some ascites. Again there was splenic enlargement. There was a large ventral wall hernia.   CURRENT TREATMENT: Coumadin  REVIEW OF  SYSTEMS: All other 10 point review of systems is negative except for those issues mentioned above.  PAST MEDICAL HISTORY: Past Medical History  Diagnosis Date  . DVT (deep venous thrombosis) 1996, 2000    h/o  . GERD (gastroesophageal reflux disease)   . Obesity, morbid   . Anticardiolipin antibody syndrome   . Hepatitis, autoimmune   . Abnormal menses   . Sleep apnea      mild to moderate  per apnea link y 3-12  . Chronic headaches   . Chronic lower back pain     Dr. Vira Blanco   PAST SURGICAL HISTORY: Past Surgical History  Procedure Laterality Date  . Dilation and curettage of uterus  11/2008   FAMILY HISTORY Family History  Problem Relation Age of Onset  . Adopted: Yes   GYNECOLOGIC HISTORY:  No LMP recorded.   SOCIAL HISTORY:  History   Social History  . Marital Status: Married    Spouse Name: N/A    Number of Children: 1  . 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 Smoking status: Former Smoker -- 0.25 packs/day for 16 years    Types: Cigarettes    Start date: 12/31/1996    Quit date: 06/29/2012  . Smokeless tobacco: Never Used     Comment: QUIT SMOKING 1.5 YEARS AGO  . Alcohol Use: Yes     Comment: occasionally-3 drinks per weekend  . Drug Use: Not on file  .  Sexual Activity: Not on file   Other Topics Concern  . Not on file   Social History Narrative  . No narrative on file   ADVANCED DIRECTIVES: <no information>  HEALTH MAINTENANCE: History  Substance Use Topics  . Smoking status: Former Smoker -- 0.25 packs/day for 16 years    Types: Cigarettes    Start date: 12/31/1996    Quit date: 06/29/2012  . Smokeless tobacco: Never Used     Comment: QUIT SMOKING 1.5 YEARS AGO  . Alcohol Use: Yes     Comment: occasionally-3 drinks per weekend   Colonoscopy: PAP: Bone density: Lipid panel:  Allergies  Allergen Reactions  . Influenza Vaccines     "Got sick x months"   Current  Outpatient Prescriptions  Medication Sig Dispense Refill  . azaTHIOprine (IMURAN) 50 MG tablet Take 50 mg by mouth daily. Takes 4 tablets daily.      . budesonide (ENTOCORT EC) 3 MG 24 hr capsule Take 3 mg by mouth daily. Takes 3 tabs q AM = 9 mg      . chlorzoxazone (PARAFON) 500 MG tablet Take by mouth 4 (four) times daily as needed for muscle spasms.      Marland Kitchen omeprazole (PRILOSEC) 20 MG capsule Take 20 mg by mouth as needed.        . Oxycodone HCl 10 MG TABS Take 15 mg by mouth 5 (five) times daily as needed. Pt takes 1 1/2 tabs  As needed      . Tapentadol HCl (NUCYNTA ER) 100 MG TB12 Take 100 mg by mouth 2 (two) times daily.      Marland Kitchen warfarin (COUMADIN) 5 MG tablet Take 1.5 to 2 tablets by mouth daily as directed by coumadin clinic  50 tablet  5   No current facility-administered medications for this visit.   OBJECTIVE: Filed Vitals:   08/31/14 1113  BP: 132/80  Pulse: 72  Temp: 98.1 F (36.7 C)  Resp: 16   Body mass index is 49.34 kg/(m^2). ECOG FS:0 - Asymptomatic Ocular: Sclerae unicteric, pupils equal, round and reactive to light Ear-nose-throat: Oropharynx clear, dentition fair Lymphatic: No cervical or supraclavicular adenopathy Lungs no rales or rhonchi, good excursion bilaterally Heart regular rate and rhythm, no murmur appreciated Abd soft, nontender, positive bowel sounds MSK no focal spinal tenderness, no joint edema Neuro: non-focal, well-oriented, appropriate affect Breasts: Deferred  LAB RESULTS: CMP     Component Value Date/Time   NA 135 05/21/2014 1220   K 4.1 05/21/2014 1220   CL 106 05/21/2014 1220   CO2 26 05/21/2014 1220   GLUCOSE 83 05/21/2014 1220   GLUCOSE 111 01/28/2009 0000   BUN 10 05/21/2014 1220   CREATININE 0.6 05/21/2014 1220   CALCIUM 8.4 05/21/2014 1220   PROT 8.6* 02/20/2011 1156   ALBUMIN 2.8* 02/20/2011 1156   AST 52* 02/20/2011 1156   ALT 39* 02/20/2011 1156   ALKPHOS 55 02/20/2011 1156   BILITOT 0.4 02/20/2011 1156   GFRNONAA >60 05/31/2009 0525    GFRAA  Value: >60        The eGFR has been calculated using the MDRD equation. This calculation has not been validated in all clinical situations. eGFR's persistently <60 mL/min signify possible Chronic Kidney Disease. 05/31/2009 0525   No results found for this basename: SPEP, UPEP,  kappa and lambda light chains   Lab Results  Component Value Date   WBC 3.0* 08/31/2014   NEUTROABS 2.2 08/31/2014   HGB 12.2 08/31/2014   HCT  36.3 08/31/2014   MCV 102* 08/31/2014   PLT 61* 08/31/2014   No results found for this basename: LABCA2   No components found with this basename: [id number]    Recent Labs Lab 08/31/14 0919  INR 5.0   STUDIES: No results found.  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  She has not had a blood clot in over 6 years. She is on Coumadin and we will continue with this same therapy. Her INR today is 5. She has had no bleeding or bruising. She has weekly checks at the Coumadin clinic. We will continue to monitor this.  We will see her back in 1 month for labs and follow-up.  We discussed her CBC. We will wait and see what the rest of her labs show.  She is in agreement with this and knows to call here with any questions or concerns. We can certainly see her sooner if need be.   Eliezer Bottom, NP 08/31/2014 12:09 PM

## 2014-09-01 LAB — ANTITHROMBIN III: ANTITHROMB III FUNC: 45 % — AB (ref 76–126)

## 2014-09-02 ENCOUNTER — Telehealth: Payer: Self-pay | Admitting: Hematology & Oncology

## 2014-09-02 NOTE — Telephone Encounter (Signed)
Mailed 9-30 schedule

## 2014-09-14 ENCOUNTER — Ambulatory Visit (INDEPENDENT_AMBULATORY_CARE_PROVIDER_SITE_OTHER): Payer: BC Managed Care – PPO | Admitting: Pharmacist Clinician (PhC)/ Clinical Pharmacy Specialist

## 2014-09-14 ENCOUNTER — Other Ambulatory Visit: Payer: Self-pay | Admitting: Pharmacist Clinician (PhC)/ Clinical Pharmacy Specialist

## 2014-09-14 DIAGNOSIS — I82409 Acute embolism and thrombosis of unspecified deep veins of unspecified lower extremity: Secondary | ICD-10-CM

## 2014-09-14 DIAGNOSIS — Z5181 Encounter for therapeutic drug level monitoring: Secondary | ICD-10-CM

## 2014-09-14 DIAGNOSIS — Z7901 Long term (current) use of anticoagulants: Secondary | ICD-10-CM

## 2014-09-14 LAB — POCT INR: INR: 1.5

## 2014-09-14 MED ORDER — WARFARIN SODIUM 5 MG PO TABS: ORAL_TABLET | ORAL | Status: AC

## 2014-09-28 ENCOUNTER — Other Ambulatory Visit: Payer: Self-pay | Admitting: *Deleted

## 2014-09-28 DIAGNOSIS — D649 Anemia, unspecified: Secondary | ICD-10-CM

## 2014-09-29 ENCOUNTER — Other Ambulatory Visit (HOSPITAL_BASED_OUTPATIENT_CLINIC_OR_DEPARTMENT_OTHER): Payer: BC Managed Care – PPO | Admitting: Lab

## 2014-09-29 ENCOUNTER — Encounter: Payer: Self-pay | Admitting: Hematology & Oncology

## 2014-09-29 ENCOUNTER — Ambulatory Visit (HOSPITAL_BASED_OUTPATIENT_CLINIC_OR_DEPARTMENT_OTHER): Payer: BC Managed Care – PPO | Admitting: Hematology & Oncology

## 2014-09-29 VITALS — BP 118/77 | HR 86 | Temp 97.5°F | Resp 16 | Ht <= 58 in | Wt 245.0 lb

## 2014-09-29 DIAGNOSIS — Z7901 Long term (current) use of anticoagulants: Secondary | ICD-10-CM

## 2014-09-29 DIAGNOSIS — K7460 Unspecified cirrhosis of liver: Secondary | ICD-10-CM

## 2014-09-29 DIAGNOSIS — R161 Splenomegaly, not elsewhere classified: Secondary | ICD-10-CM

## 2014-09-29 DIAGNOSIS — D649 Anemia, unspecified: Secondary | ICD-10-CM

## 2014-09-29 DIAGNOSIS — K754 Autoimmune hepatitis: Secondary | ICD-10-CM

## 2014-09-29 DIAGNOSIS — D708 Other neutropenia: Secondary | ICD-10-CM

## 2014-09-29 DIAGNOSIS — I82409 Acute embolism and thrombosis of unspecified deep veins of unspecified lower extremity: Secondary | ICD-10-CM

## 2014-09-29 LAB — CBC WITH DIFFERENTIAL (CANCER CENTER ONLY)
BASO#: 0 10*3/uL (ref 0.0–0.2)
BASO%: 0.3 % (ref 0.0–2.0)
EOS ABS: 0 10*3/uL (ref 0.0–0.5)
EOS%: 0.3 % (ref 0.0–7.0)
HCT: 36.9 % (ref 34.8–46.6)
HGB: 12.2 g/dL (ref 11.6–15.9)
LYMPH#: 0.8 10*3/uL — AB (ref 0.9–3.3)
LYMPH%: 20.8 % (ref 14.0–48.0)
MCH: 34.3 pg — AB (ref 26.0–34.0)
MCHC: 33.1 g/dL (ref 32.0–36.0)
MCV: 104 fL — ABNORMAL HIGH (ref 81–101)
MONO#: 0.7 10*3/uL (ref 0.1–0.9)
MONO%: 19 % — AB (ref 0.0–13.0)
NEUT%: 59.6 % (ref 39.6–80.0)
NEUTROS ABS: 2.3 10*3/uL (ref 1.5–6.5)
PLATELETS: 65 10*3/uL — AB (ref 145–400)
RBC: 3.56 10*6/uL — ABNORMAL LOW (ref 3.70–5.32)
RDW: 18 % — ABNORMAL HIGH (ref 11.1–15.7)
WBC: 3.9 10*3/uL (ref 3.9–10.0)

## 2014-09-29 LAB — PROTIME-INR (CHCC SATELLITE)
INR: 1.5 — AB (ref 2.0–3.5)
PROTIME: 18 s — AB (ref 10.6–13.4)

## 2014-09-29 LAB — CHCC SATELLITE - SMEAR

## 2014-09-29 NOTE — Progress Notes (Signed)
Hematology and Oncology Follow Up Visit  Margaret [name] [id number] [birth year] 39 y.o. 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 She's been doing okay. She has no new problems.  Of note, she does have cirrhosis. This probably is the likely reason for her blood counts and also for the splenomegaly. Again, I think that with her splenomegaly, she has a chronic leukopenia and thrombocytopenia.  She's had no bleeding. She does have esophageal varices.  She's had no fever. She's had no nausea or vomiting. She's had no rashes. Her skin does get a little dry is how year.  She's had no change in bowel or bladder habits.  We repeated her anti-thrombin III level. It was only 45% activity. She has a minimally elevated anti-cardiolipin IgM level. I really don't think this is significant with respect to her thromboembolic disease. We really cannot measure her protein C or protein S levels because she is on Coumadin.  Medications: Current outpatient prescriptions:azaTHIOprine (IMURAN) 50 MG tablet, Take 50 mg by mouth daily. Takes 4 tablets daily., Disp: , Rfl: ;  budesonide (ENTOCORT EC) 3 MG 24 hr capsule, Take 3 mg by mouth daily. Takes 3 tabs q AM = 9 mg, Disp: , Rfl: ;  chlorzoxazone (PARAFON) 500 MG tablet, Take by mouth 4 (four) times daily as needed for muscle spasms., Disp: , Rfl: ;  omeprazole (PRILOSEC) 20 MG capsule, Take 20 mg by mouth as needed.  , Disp: , Rfl:  Oxycodone HCl 10 MG TABS, Take 15 mg by mouth 5 (five) times daily as needed. Pt takes 1 1/2 tabs  As needed, Disp: , Rfl: ;  Tapentadol HCl (NUCYNTA ER) 100 MG TB12, Take 100 mg by mouth 2 (two) times daily., Disp: , Rfl: ;  warfarin (COUMADIN) 5 MG tablet, Take 1.5 to 2 tablets by mouth daily as directed  by coumadin clinic, Disp: 50 tablet, Rfl: 5  Allergies:  Allergies  Allergen Reactions  . Influenza Vaccines     "Got sick x months"    Past Medical History, Surgical history, Social history, and Family History were reviewed and updated.  Review of Systems: As above  Physical Exam:  height is 4\' 10"  (1.473 m) and weight is 245 lb (111.131 kg). Her oral temperature is 97.5 F (36.4 C). Her blood pressure is 118/77 and her pulse is 86. Her respiration is 16.   Obese [place] female in no obvious distress. Head exam shows no ocular or oral lesions. She has no palpable cervical or supraclavicular lymph nodes. Lungs are clear. Cardiac exam regular in rhythm with no murmurs, rubs or bruits. Abdomen is soft. She has good bowel sounds. There is no fluid wave. There is no palpable liver or spleen tip. Back exam shows no tenderness over the spine, ribs or hips. Extremities shows no clubbing, cyanosis or edema. Extremities shows some slight nonpitting edema of the left lower leg. Skin exam no rashes, ecchymosis or petechia. Neurological exam is nonfocal.  Lab Results  Component Value Date   WBC 3.9 09/29/2014   HGB 12.2 09/29/2014   HCT 36.9 09/29/2014   MCV 104* 09/29/2014   PLT 65* 09/29/2014     Chemistry      Component Value Date/Time   NA 135 05/21/2014 1220   K 4.1  05/21/2014 1220   CL 106 05/21/2014 1220   CO2 26 05/21/2014 1220   BUN 10 05/21/2014 1220   CREATININE 0.6 05/21/2014 1220      Component Value Date/Time   CALCIUM 8.4 05/21/2014 1220   ALKPHOS 55 02/20/2011 1156   AST 52* 02/20/2011 1156   ALT 39* 02/20/2011 1156   BILITOT 0.4 02/20/2011 1156         Impression and Plan: Margaret [name] is 39 year old female. She has cirrhosis. She is autoimmune hepatitis. She has varices. She is splenomegaly. She has all of the sequela of cirrhosis.  Again, because of the splenomegaly, I think her blood counts will be low with respect to her Garvey cells and platelets.  She is clearly  hypercoagulable. As such, I think her risk of bleeding with Coumadin is to be low.  There is not much that we need to the right now. She does not need a bone marrow test.  We will plan to get her back in another couple months.  I spent about 30 minutes with her today. I try to go over all of her labs. I explained to her what I thought was going on. I plan to her about the hepatitis causing her cirrhosis which, in turn, and is leading to the abnormal blood count.   Volanda Napoleon, MD 9/30/20155:19 PM

## 2014-09-30 ENCOUNTER — Telehealth: Payer: Self-pay | Admitting: Hematology & Oncology

## 2014-09-30 NOTE — Telephone Encounter (Signed)
Mailed dec schedule

## 2014-10-08 ENCOUNTER — Ambulatory Visit (INDEPENDENT_AMBULATORY_CARE_PROVIDER_SITE_OTHER): Payer: Self-pay | Admitting: Pharmacist

## 2014-10-08 DIAGNOSIS — Z5181 Encounter for therapeutic drug level monitoring: Secondary | ICD-10-CM

## 2014-10-08 DIAGNOSIS — Z7901 Long term (current) use of anticoagulants: Secondary | ICD-10-CM

## 2014-10-08 DIAGNOSIS — I82409 Acute embolism and thrombosis of unspecified deep veins of unspecified lower extremity: Secondary | ICD-10-CM

## 2014-10-08 LAB — POCT INR: INR: 1.5

## 2014-10-14 ENCOUNTER — Telehealth: Payer: Self-pay | Admitting: *Deleted

## 2014-10-14 NOTE — Telephone Encounter (Signed)
Medical record request received via mail from the [employer]. Forms forwarded to [facility]. JG//CMA

## 2014-11-16 ENCOUNTER — Telehealth: Payer: Self-pay | Admitting: *Deleted

## 2014-11-16 NOTE — Telephone Encounter (Signed)
Received medical record request via mail from [employer]. Forms forwarded to [facility]. JG//CMA

## 2014-12-06 ENCOUNTER — Telehealth: Payer: Self-pay | Admitting: Hematology & Oncology

## 2014-12-06 NOTE — Telephone Encounter (Signed)
Pt moved 12-9 to 12-16

## 2014-12-08 ENCOUNTER — Ambulatory Visit: Payer: BC Managed Care – PPO | Admitting: Family

## 2014-12-08 ENCOUNTER — Other Ambulatory Visit: Payer: BC Managed Care – PPO | Admitting: Lab

## 2014-12-15 ENCOUNTER — Ambulatory Visit (HOSPITAL_BASED_OUTPATIENT_CLINIC_OR_DEPARTMENT_OTHER): Payer: 59 | Admitting: Family

## 2014-12-15 ENCOUNTER — Encounter: Payer: Self-pay | Admitting: Family

## 2014-12-15 ENCOUNTER — Other Ambulatory Visit (HOSPITAL_BASED_OUTPATIENT_CLINIC_OR_DEPARTMENT_OTHER): Payer: 59 | Admitting: Lab

## 2014-12-15 DIAGNOSIS — D649 Anemia, unspecified: Secondary | ICD-10-CM

## 2014-12-15 DIAGNOSIS — K754 Autoimmune hepatitis: Secondary | ICD-10-CM

## 2014-12-15 DIAGNOSIS — D72819 Decreased white blood cell count, unspecified: Secondary | ICD-10-CM

## 2014-12-15 DIAGNOSIS — K7460 Unspecified cirrhosis of liver: Secondary | ICD-10-CM

## 2014-12-15 DIAGNOSIS — I82409 Acute embolism and thrombosis of unspecified deep veins of unspecified lower extremity: Secondary | ICD-10-CM

## 2014-12-15 DIAGNOSIS — D696 Thrombocytopenia, unspecified: Secondary | ICD-10-CM

## 2014-12-15 DIAGNOSIS — R161 Splenomegaly, not elsewhere classified: Secondary | ICD-10-CM

## 2014-12-15 LAB — CBC WITH DIFFERENTIAL (CANCER CENTER ONLY)
BASO#: 0 10*3/uL (ref 0.0–0.2)
BASO%: 0.6 % (ref 0.0–2.0)
EOS%: 2.2 % (ref 0.0–7.0)
Eosinophils Absolute: 0.1 10*3/uL (ref 0.0–0.5)
HCT: 38.6 % (ref 34.8–46.6)
HGB: 12.5 g/dL (ref 11.6–15.9)
LYMPH#: 0.8 10*3/uL — ABNORMAL LOW (ref 0.9–3.3)
LYMPH%: 23.1 % (ref 14.0–48.0)
MCH: 32.1 pg (ref 26.0–34.0)
MCHC: 32.4 g/dL (ref 32.0–36.0)
MCV: 99 fL (ref 81–101)
MONO#: 0.5 10*3/uL (ref 0.1–0.9)
MONO%: 14.5 % — ABNORMAL HIGH (ref 0.0–13.0)
NEUT#: 1.9 10*3/uL (ref 1.5–6.5)
NEUT%: 59.6 % (ref 39.6–80.0)
Platelets: 58 10*3/uL — ABNORMAL LOW (ref 145–400)
RBC: 3.9 10*6/uL (ref 3.70–5.32)
RDW: 18.7 % — AB (ref 11.1–15.7)
WBC: 3.2 10*3/uL — ABNORMAL LOW (ref 3.9–10.0)

## 2014-12-15 LAB — CMP (CANCER CENTER ONLY)
ALK PHOS: 82 U/L (ref 26–84)
ALT(SGPT): 99 U/L — ABNORMAL HIGH (ref 10–47)
AST: 122 U/L — ABNORMAL HIGH (ref 11–38)
Albumin: 1.8 g/dL — ABNORMAL LOW (ref 3.3–5.5)
BILIRUBIN TOTAL: 1.2 mg/dL (ref 0.20–1.60)
BUN, Bld: 12 mg/dL (ref 7–22)
CO2: 26 mEq/L (ref 18–33)
Calcium: 7.3 mg/dL — ABNORMAL LOW (ref 8.0–10.3)
Chloride: 106 mEq/L (ref 98–108)
Creat: 0.6 mg/dl (ref 0.6–1.2)
Glucose, Bld: 109 mg/dL (ref 73–118)
POTASSIUM: 3.6 meq/L (ref 3.3–4.7)
SODIUM: 135 meq/L (ref 128–145)
TOTAL PROTEIN: 7.8 g/dL (ref 6.4–8.1)

## 2014-12-15 LAB — CHCC SATELLITE - SMEAR

## 2014-12-15 NOTE — Progress Notes (Signed)
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 Margaret [name] is here today for a follow-up. She is still having a hard time with her hernia and the pain caused by it. She hasn't been able to find a surgeon to operate on her and repair it. She is followed by the pain clinic.  She is doing well on Coumadin. She has had no bleeding.  She is tired a lot of the time and still has some nausea that affects her appetite. She states that her weight fluctuates "up and down." She is down 7 lbs since her last visit. When she does eat she usually has diarrhea.  She denies fever, chills, cough, rash, dizziness, SOB, chest pain, palpitations, constipation, blood in urine or stool.  She has had no swelling, tenderness, numbness or tingling in her extremities.  Her WBC today are 3.2. She has had no problem with infections.  She still has a spall bruise with a pimple like bump on her right arm. This has been on her arm for over 6 months. I also had Dr. Marin Olp take a look. We will consult Dermatology to see if anything needs to be done.    CURRENT TREATMENT: Lifelong Coumadin  REVIEW OF SYSTEMS: All other 10 point review of systems is negative except for those issues mentioned above.  PAST MEDICAL HISTORY: Past Medical History  Diagnosis Date  . DVT (deep venous thrombosis) 1996, 2000    h/o  . GERD (gastroesophageal reflux disease)   . Obesity, morbid   . Anticardiolipin antibody syndrome   . Hepatitis, autoimmune   . Abnormal menses   . Sleep apnea      mild to moderate  per apnea link y 3-12  . Chronic headaches   . Chronic  lower back pain     Dr. Vira Blanco   PAST SURGICAL HISTORY: Past Surgical History  Procedure Laterality Date  . Dilation and curettage of uterus  11/2008   FAMILY HISTORY Family History  Problem Relation Age of Onset  . Adopted: Yes   GYNECOLOGIC HISTORY:  No LMP recorded.   SOCIAL HISTORY:  History   Social History  . Marital Status: Married    Spouse Name: N/A    Number of Children: 1  . 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 Smoking status: Former Smoker -- 0.25 packs/day for 16 years    Types: Cigarettes    Start date: 12/31/1996    Quit date: 06/29/2012  . Smokeless tobacco: Never Used     Comment: QUIT SMOKING 1.5 YEARS AGO  . Alcohol Use: Yes     Comment: occasionally-3 drinks per weekend  . Drug Use: Not on file  . Sexual Activity: Not on file   Other Topics Concern  . Not on file   Social History Narrative   ADVANCED DIRECTIVES: <no information>  HEALTH MAINTENANCE: History  Substance Use Topics  . Smoking status: Former Smoker -- 0.25 packs/day for 16 years    Types: Cigarettes    Start date: 12/31/1996    Quit date: 06/29/2012  . Smokeless tobacco: Never Used  Comment: QUIT SMOKING 1.5 YEARS AGO  . Alcohol Use: Yes     Comment: occasionally-3 drinks per weekend   Colonoscopy: PAP: Bone density: Lipid panel:  Allergies  Allergen Reactions  . Influenza Vaccines     "Got sick x months"   Current Outpatient Prescriptions  Medication Sig Dispense Refill  . azaTHIOprine (IMURAN) 50 MG tablet Take 50 mg by mouth daily. Takes 4 tablets daily.    . budesonide (ENTOCORT EC) 3 MG 24 hr capsule Take 3 mg by mouth daily. Takes 3 tabs q AM = 9 mg    . chlorzoxazone (PARAFON) 500 MG tablet Take by mouth 4 (four) times daily as needed for muscle spasms.    . omeprazole (PRILOSEC) 20 MG capsule Take 20 mg by mouth as needed.      . Oxycodone HCl 10 MG TABS Take 15 mg by mouth 5 (five) times  daily as needed. Pt takes 1 1/2 tabs  As needed    . Tapentadol HCl (NUCYNTA ER) 100 MG TB12 Take 100 mg by mouth 2 (two) times daily.    . warfarin (COUMADIN) 5 MG tablet Take 1.5 to 2 tablets by mouth daily as directed by coumadin clinic 50 tablet 5   No current facility-administered medications for this visit.   OBJECTIVE: Filed Vitals:   12/15/14 1425  BP: 141/91  Pulse: 92  Temp: 97.8 F (36.6 C)  Resp: 14   Body mass index is 48.04 kg/(m^2). ECOG FS:0 - Asymptomatic Ocular: Sclerae unicteric, pupils equal, round and reactive to light Ear-nose-throat: Oropharynx clear, dentition fair Lymphatic: No cervical or supraclavicular adenopathy Lungs no rales or rhonchi, good excursion bilaterally Heart regular rate and rhythm, no murmur appreciated Abd soft, nontender, positive bowel sounds MSK no focal spinal tenderness, no joint edema Neuro: non-focal, well-oriented, appropriate affect Breasts: Deferred  LAB RESULTS: CMP     Component Value Date/Time   NA 135 12/15/2014 1412   NA 135 05/21/2014 1220   K 3.6 12/15/2014 1412   K 4.1 05/21/2014 1220   CL 106 12/15/2014 1412   CL 106 05/21/2014 1220   CO2 26 12/15/2014 1412   CO2 26 05/21/2014 1220   GLUCOSE 109 12/15/2014 1412   GLUCOSE 83 05/21/2014 1220   BUN 12 12/15/2014 1412   BUN 10 05/21/2014 1220   CREATININE 0.6 12/15/2014 1412   CREATININE 0.6 05/21/2014 1220   CALCIUM 7.3* 12/15/2014 1412   CALCIUM 8.4 05/21/2014 1220   PROT 7.8 12/15/2014 1412   PROT 8.6* 02/20/2011 1156   ALBUMIN 2.8* 02/20/2011 1156   AST 122* 12/15/2014 1412   AST 52* 02/20/2011 1156   ALT 99* 12/15/2014 1412   ALT 39* 02/20/2011 1156   ALKPHOS 82 12/15/2014 1412   ALKPHOS 55 02/20/2011 1156   BILITOT 1.20 12/15/2014 1412   BILITOT 0.4 02/20/2011 1156   GFRNONAA >60 05/31/2009 0525   GFRAA  05/31/2009 0525    >60        The eGFR has been calculated using the MDRD equation. This calculation has not been validated in all  clinical situations. eGFR's persistently <60 mL/min signify possible Chronic Kidney Disease.   No results found for: SPEP Lab Results  Component Value Date   WBC 3.2* 12/15/2014   NEUTROABS 1.9 12/15/2014   HGB 12.5 12/15/2014   HCT 38.6 12/15/2014   MCV 99 12/15/2014   PLT 58* 12/15/2014   No results found for: LABCA2 No components found for: LABCA125 No results for input(s): INR in   the last 168 hours. STUDIES: No results found.  ASSESSMENT/PLAN: Ms. Termine is a 38-year-old female with leukopenia thrombocytopenia secondary to her cirrhosis and splenomegaly.  She has not had a blood clot in over 6 years. She is on Coumadin and we will continue with this same therapy. She has had no bleeding. She has weekly checks at the Coumadin clinic.  We will consult dermatology about the spot on her right arm.  We will see her back in 6 months for labs and follow-up.  We discussed her CBC. We will wait and see what the rest of her labs show.  She is in agreement with this and knows to call here with any questions or concerns. We can certainly see her sooner if need be.   , M, NP 12/15/2014 3:12 PM 

## 2015-01-10 ENCOUNTER — Ambulatory Visit (INDEPENDENT_AMBULATORY_CARE_PROVIDER_SITE_OTHER): Payer: 59 | Admitting: Family

## 2015-01-10 ENCOUNTER — Telehealth: Payer: Self-pay | Admitting: Internal Medicine

## 2015-01-10 ENCOUNTER — Encounter: Payer: Self-pay | Admitting: Family

## 2015-01-10 VITALS — BP 124/72 | HR 81 | Temp 97.9°F | Resp 16 | Wt 250.0 lb

## 2015-01-10 DIAGNOSIS — K7460 Unspecified cirrhosis of liver: Secondary | ICD-10-CM

## 2015-01-10 DIAGNOSIS — K439 Ventral hernia without obstruction or gangrene: Secondary | ICD-10-CM

## 2015-01-10 DIAGNOSIS — I82409 Acute embolism and thrombosis of unspecified deep veins of unspecified lower extremity: Secondary | ICD-10-CM

## 2015-01-10 DIAGNOSIS — R062 Wheezing: Secondary | ICD-10-CM

## 2015-01-10 DIAGNOSIS — K754 Autoimmune hepatitis: Secondary | ICD-10-CM

## 2015-01-10 DIAGNOSIS — L03311 Cellulitis of abdominal wall: Secondary | ICD-10-CM

## 2015-01-10 DIAGNOSIS — R188 Other ascites: Principal | ICD-10-CM

## 2015-01-10 DIAGNOSIS — L0390 Cellulitis, unspecified: Secondary | ICD-10-CM | POA: Insufficient documentation

## 2015-01-10 LAB — BASIC METABOLIC PANEL
BUN: 14 mg/dL (ref 6–23)
CALCIUM: 8 mg/dL — AB (ref 8.4–10.5)
CHLORIDE: 104 meq/L (ref 96–112)
CO2: 27 mEq/L (ref 19–32)
Creatinine, Ser: 0.6 mg/dL (ref 0.4–1.2)
GFR: 116.48 mL/min (ref >60.00)
Glucose, Bld: 106 mg/dL — ABNORMAL HIGH (ref 70–99)
Potassium: 3.6 mEq/L (ref 3.5–5.1)
SODIUM: 136 meq/L (ref 135–145)

## 2015-01-10 LAB — MAGNESIUM: MAGNESIUM: 1.7 mg/dL (ref 1.5–2.5)

## 2015-01-10 LAB — POCT INR: INR: 2.1

## 2015-01-10 NOTE — Progress Notes (Signed)
Subjective:    Patient ID: Margaret [name], female    DOB: 1975/11/13, 40 y.o.   MRN: [id number]  HPI  Margaret [name] is a 40 yr old female who presents today for hospital follow up.  Records are reviewed through [facility]. She was admitted to [facility] with chief complaint of abdominal pain was admitted last Wednesday 01/05/14 and was discharged Saturday afternoon 01/08/15.  She has a significant pmhx of autoimmune hepatitis, cirrhosis, portal hypertension, DVt's and ventral hernia.  On the day of admission she presented with nausea/vomitting and abdominal pain.  She was noted to have hyperbilirubinemia and GB stones on CT and [place]. Note was also made of GB distension on imaging.   She was admitted and hepatology and general surgery were consulted.  Ultimately, per pt it was decided not to pursue gallbladder surgery as this was felt not to be primary issue, but instead to treat her abdominal rash/cellulitis. Also, plan is to repair ventral hernia once her volume status has been optimized.    Rash/cellulitis- abdominal.  Per review of discharge summary, " Patient had a very thorough evaluation of her abdomen including multiple imaging studies and evaluation by rheumatology and dermatology consult services. Her condition is likely due to a combination of subcutaneous edema into her pannus and surface irritation of the pannus, likely an eczema type reaction per dermatology. Other contributing factors to her abdominal pain include UTI, which was treated with ceftriaxone 3, and the passing of small gallstones which were visualized on CT and ultrasound."  Her skin infection was treated initially with vancomycin, then she was transitioned to doxycycline. She reports that the skin infection "looks better" since she returned from the hospital.  She was given an 8 day supply of PO doxycycline at time of discharge.     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 She had some mild hematochezia while hospitalized and a supratherapeutic inr at that time which was attributed to her liver pathology. She was treated with vitamin K.   UTI- she was treated with ceftriaxone while hospitalized. However, final culture was polymicrobial.    Autoimmune hepatitis- she is currently maintained on azathioprin and budesonide. This is followed by Dr. Monica Martinez at [facility].  She tells me that she is not yet on transplant list because she her disease is not quite advanced enough to qualify.   Dizziness- reports that she is taking in fluids.  Tolerating solids.   Review of Systems    see HPI  Past Medical History  Diagnosis Date  . DVT (deep venous thrombosis) 1996, 2000    h/o  . GERD (gastroesophageal reflux disease)   . Obesity, morbid   . Anticardiolipin antibody syndrome   . Hepatitis, autoimmune   . Abnormal menses   . Sleep apnea      mild to moderate  per apnea link y 3-12  . Chronic headaches   . Chronic lower back pain     Dr. Vira Blanco    History   Social History  . Marital Status: Married    Spouse Name: N/A    Number of Children: 1  . 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 Smoking status: Former Smoker -- 0.25 packs/day for 16 years    Types: Cigarettes    Start date: 12/31/1996    Quit date: 06/29/2012  . Smokeless tobacco: Never  Used     Comment: QUIT SMOKING 1.5 YEARS AGO  . Alcohol Use: Yes     Comment: occasionally-3 drinks per weekend  . Drug Use: Not on file  . Sexual Activity: Not on file   Other Topics Concern  . Not on file   Social History Narrative    Past Surgical History  Procedure Laterality Date  . Dilation and curettage of uterus  11/2008    Family History  Problem Relation Age of Onset  . Adopted: Yes    Allergies  Allergen Reactions  . Influenza Vaccines     "Got sick x months"    Current Outpatient Prescriptions on File Prior to Visit   Medication Sig Dispense Refill  . azaTHIOprine (IMURAN) 50 MG tablet Take 50 mg by mouth daily. Takes 4 tablets daily.    . budesonide (ENTOCORT EC) 3 MG 24 hr capsule Take 3 mg by mouth daily. Takes 3 tabs q AM = 9 mg    . chlorzoxazone (PARAFON) 500 MG tablet Take by mouth 4 (four) times daily as needed for muscle spasms.    Marland Kitchen omeprazole (PRILOSEC) 20 MG capsule Take 20 mg by mouth as needed.      . Oxycodone HCl 10 MG TABS Take 15 mg by mouth 5 (five) times daily as needed. Pt takes 1 1/2 tabs  As needed    . Tapentadol HCl (NUCYNTA ER) 100 MG TB12 Take 100 mg by mouth 2 (two) times daily.    Marland Kitchen warfarin (COUMADIN) 5 MG tablet Take 1.5 to 2 tablets by mouth daily as directed by coumadin clinic 50 tablet 5   No current facility-administered medications on file prior to visit.    BP 124/72 mmHg  Pulse 81  Temp(Src) 97.9 F (36.6 C) (Oral)  Resp 16  Wt 250 lb (113.399 kg)  SpO2 100%  LMP 12/31/2012    Objective:   Physical Exam  Constitutional: She is oriented to person, place, and time.  Chronically ill appearing Margaret [name] female, appears older than stated age. Very slightly jaundiced appearance is noted.   HENT:  Head: Normocephalic and atraumatic.  Cardiovascular: Normal rate and regular rhythm.   No murmur heard. Pulmonary/Chest: Effort normal. No respiratory distress. She has wheezes. She has no rales. She exhibits no tenderness.  Abdominal:  Abdomen is very large and distended, + umbilical hernia is noted.   Musculoskeletal:  3+ bilateral LE edema is noted  Neurological: She is alert and oriented to person, place, and time.  Skin: Skin is warm and dry.  Some edema noted in subcutaneous tissue of the lower abdomen with only slight erythema noted.   Psychiatric: She has a normal mood and affect. Her behavior is normal. Judgment normal.          Assessment & Plan:

## 2015-01-10 NOTE — Telephone Encounter (Signed)
error 

## 2015-01-10 NOTE — Patient Instructions (Addendum)
Please complete lab work prior to leaving. You may use your albuterol inhaler 2 puffs every 4-6 hours as needed for wheezing. Keep your upcoming appointment with Dr. Monica Martinez, Dermatology and pain management. Complete doxycycline.  Call if you develop recurrent redness/pain on your abdomen or if you develop fever.   Schedule follow up with coumadin clinic in 1 weeks. Follow up with Dr. Larose Kells in 2 months.

## 2015-01-10 NOTE — Progress Notes (Signed)
Pre visit review using our clinic review tool, if applicable. No additional management support is needed unless otherwise documented below in the visit note. 

## 2015-01-10 NOTE — Assessment & Plan Note (Addendum)
Improving- nearly resolved.  Advised pt to complete doxycycline rx.

## 2015-01-12 DIAGNOSIS — R062 Wheezing: Secondary | ICD-10-CM | POA: Insufficient documentation

## 2015-01-12 NOTE — Assessment & Plan Note (Signed)
CXR ray on 1/9 was clear. Add albuterol MDI prn.

## 2015-01-12 NOTE — Assessment & Plan Note (Signed)
Continue lasix for diuresis.  She is above her pre-hospitalization weight of 238.  BMET and magnesium are WNL. She is instructed to keep her upcoming appointment with GI.  Wt Readings from Last 3 Encounters:  01/10/15 250 lb (113.399 kg)  12/15/14 238 lb (107.956 kg)  09/29/14 245 lb (111.131 kg)

## 2015-01-12 NOTE — Assessment & Plan Note (Signed)
INR today in office is 2.1.  Given her recent dangerously high supratherapeutic INR at time of admission and current abx, I have advised the patient to take coumadin 7.5mg  once daily and follow up in coumadin clinic in 1 week.   She has previously been taking 10mg  4 days a week and 7.5mg  3 days a week.  Defer further dose changes to coumadin clinic.

## 2015-01-12 NOTE — Assessment & Plan Note (Signed)
Not medically optimized due to ascities.  Per patient Dr. Monica Martinez told her that he will refer her back to surgeon when ascites is optimized.

## 2015-01-13 ENCOUNTER — Telehealth: Payer: Self-pay | Admitting: Internal Medicine

## 2015-01-13 NOTE — Telephone Encounter (Signed)
Please advise 

## 2015-01-13 NOTE — Telephone Encounter (Signed)
Caller name: Batsheva Relation to pt: self Call back number: [phone] Pharmacy:  Reason for call:   Patient states that she has been having a bad taste in her mouth since she has been on all the new medications. She talked to the pharmacist and they told her that this wasn't a side effect. She wants to know what she should do about this

## 2015-01-14 NOTE — Telephone Encounter (Signed)
Pt saw Melissa on 01/10/2015 for cirrhosis of liver. Pt has not seen Dr. Larose Kells in over 1 year. Please see message from Pt below regarding "medication side effects."

## 2015-01-14 NOTE — Telephone Encounter (Signed)
Was recently seen by [facility], see if she has any advise I haven't seen the patient in a year.   Schedule a visit if necessary

## 2015-01-14 NOTE — Telephone Encounter (Signed)
Notified pt per verbal from NP, O'sullivan, thinks symptom is likely related to the Doxycycline. Advised pt to see if symptoms improve after a couple days being off of medication and to call the office if no improvement.  Pt voices understanding.

## 2015-01-17 ENCOUNTER — Other Ambulatory Visit: Payer: Self-pay | Admitting: Dermatology

## 2015-01-17 ENCOUNTER — Ambulatory Visit (INDEPENDENT_AMBULATORY_CARE_PROVIDER_SITE_OTHER): Payer: 59

## 2015-01-17 DIAGNOSIS — Z7901 Long term (current) use of anticoagulants: Secondary | ICD-10-CM

## 2015-01-17 DIAGNOSIS — I82409 Acute embolism and thrombosis of unspecified deep veins of unspecified lower extremity: Secondary | ICD-10-CM

## 2015-01-17 DIAGNOSIS — Z5181 Encounter for therapeutic drug level monitoring: Secondary | ICD-10-CM

## 2015-01-17 LAB — POCT INR: INR: 2.3

## 2015-02-01 ENCOUNTER — Other Ambulatory Visit: Payer: Self-pay

## 2015-02-01 ENCOUNTER — Telehealth: Payer: Self-pay | Admitting: Internal Medicine

## 2015-02-01 DIAGNOSIS — K754 Autoimmune hepatitis: Secondary | ICD-10-CM

## 2015-02-01 NOTE — Telephone Encounter (Signed)
Spoke with Margaret [name], informed her that we only have TDaP and PPD in immunization list. Informed her if she was born in [place] she may call the health dept and receive immunization records that Dr. Monica Martinez (Pts nephrologist) is requesting. I also informed her we may be able to do a Titer on her for Hep A and B. Margaret [name] requested titers to be completed and faxed to Dr. Monica Martinez, informed her I would send a message to Dr. Larose Kells regarding Titers, Margaret [name] verbalized understanding.

## 2015-02-01 NOTE — Telephone Encounter (Signed)
I just  enter hep A and B serology, to be done at her earliest convenience

## 2015-02-01 NOTE — Telephone Encounter (Signed)
LMOM informing her that Hep A and B serology (Titer) has been ordered, that she can call and make lab appt at her earliest convenience.

## 2015-02-01 NOTE — Telephone Encounter (Signed)
Caller name: Margaret [name], Margaret [name] Relation to pt: self  Call back number: [phone] Pharmacy:  Reason for call:  Pt states as per liver doctor liver is getting worst and wants to ensure A and B vaccination in addition to other vaccinations are complete. Please advise

## 2015-02-02 NOTE — Telephone Encounter (Signed)
Pt has lab appt scheduled for 02/03/2015 at 0900 for Hep A and Hep B serology.

## 2015-02-03 ENCOUNTER — Other Ambulatory Visit: Payer: 59

## 2015-02-04 ENCOUNTER — Ambulatory Visit (INDEPENDENT_AMBULATORY_CARE_PROVIDER_SITE_OTHER): Payer: 59 | Admitting: Pharmacist

## 2015-02-04 DIAGNOSIS — I82409 Acute embolism and thrombosis of unspecified deep veins of unspecified lower extremity: Secondary | ICD-10-CM

## 2015-02-04 DIAGNOSIS — Z7901 Long term (current) use of anticoagulants: Secondary | ICD-10-CM

## 2015-02-04 DIAGNOSIS — Z5181 Encounter for therapeutic drug level monitoring: Secondary | ICD-10-CM

## 2015-02-04 LAB — POCT INR: INR: 3.2

## 2015-02-10 ENCOUNTER — Telehealth: Payer: Self-pay | Admitting: *Deleted

## 2015-02-10 NOTE — Telephone Encounter (Signed)
Patient called stating she has a red rash that doesn't itch or painful and doesn't look like petechiae.  Dr. Marin Olp notified.  To see Margaret Cruz NP tomorrow in office

## 2015-02-11 ENCOUNTER — Inpatient Hospital Stay (HOSPITAL_BASED_OUTPATIENT_CLINIC_OR_DEPARTMENT_OTHER)
Admission: EM | Admit: 2015-02-11 | Discharge: 2015-02-13 | DRG: 442 | Disposition: A | Discharge status: Home / Self Care | Payer: 59 | Attending: Internal Medicine | Admitting: Internal Medicine

## 2015-02-11 ENCOUNTER — Telehealth: Payer: Self-pay | Admitting: Hematology & Oncology

## 2015-02-11 ENCOUNTER — Emergency Department (HOSPITAL_BASED_OUTPATIENT_CLINIC_OR_DEPARTMENT_OTHER): Payer: 59

## 2015-02-11 ENCOUNTER — Ambulatory Visit: Payer: 59 | Admitting: Family

## 2015-02-11 ENCOUNTER — Encounter (HOSPITAL_BASED_OUTPATIENT_CLINIC_OR_DEPARTMENT_OTHER): Payer: Self-pay | Admitting: *Deleted

## 2015-02-11 ENCOUNTER — Other Ambulatory Visit: Payer: 59 | Admitting: Lab

## 2015-02-11 DIAGNOSIS — Z86718 Personal history of other venous thrombosis and embolism: Secondary | ICD-10-CM | POA: Diagnosis not present

## 2015-02-11 DIAGNOSIS — D696 Thrombocytopenia, unspecified: Secondary | ICD-10-CM | POA: Diagnosis present

## 2015-02-11 DIAGNOSIS — K7290 Hepatic failure, unspecified without coma: Secondary | ICD-10-CM | POA: Diagnosis present

## 2015-02-11 DIAGNOSIS — K754 Autoimmune hepatitis: Secondary | ICD-10-CM | POA: Diagnosis present

## 2015-02-11 DIAGNOSIS — D6861 Antiphospholipid syndrome: Secondary | ICD-10-CM | POA: Diagnosis present

## 2015-02-11 DIAGNOSIS — Z79891 Long term (current) use of opiate analgesic: Secondary | ICD-10-CM

## 2015-02-11 DIAGNOSIS — Z87891 Personal history of nicotine dependence: Secondary | ICD-10-CM | POA: Diagnosis not present

## 2015-02-11 DIAGNOSIS — Z887 Allergy status to serum and vaccine status: Secondary | ICD-10-CM

## 2015-02-11 DIAGNOSIS — G8929 Other chronic pain: Secondary | ICD-10-CM | POA: Diagnosis present

## 2015-02-11 DIAGNOSIS — G4730 Sleep apnea, unspecified: Secondary | ICD-10-CM | POA: Diagnosis present

## 2015-02-11 DIAGNOSIS — Z7901 Long term (current) use of anticoagulants: Secondary | ICD-10-CM | POA: Diagnosis not present

## 2015-02-11 DIAGNOSIS — K219 Gastro-esophageal reflux disease without esophagitis: Secondary | ICD-10-CM | POA: Diagnosis present

## 2015-02-11 DIAGNOSIS — R188 Other ascites: Secondary | ICD-10-CM

## 2015-02-11 DIAGNOSIS — R4182 Altered mental status, unspecified: Secondary | ICD-10-CM

## 2015-02-11 DIAGNOSIS — D649 Anemia, unspecified: Secondary | ICD-10-CM | POA: Diagnosis present

## 2015-02-11 DIAGNOSIS — M545 Low back pain: Secondary | ICD-10-CM | POA: Diagnosis present

## 2015-02-11 DIAGNOSIS — L0390 Cellulitis, unspecified: Secondary | ICD-10-CM | POA: Diagnosis present

## 2015-02-11 DIAGNOSIS — K7682 Hepatic encephalopathy: Secondary | ICD-10-CM | POA: Diagnosis present

## 2015-02-11 LAB — CBC WITH DIFFERENTIAL/PLATELET
BASOS ABS: 0 10*3/uL (ref 0.0–0.1)
Basophils Relative: 0 % (ref 0–1)
EOS PCT: 1 % (ref 0–5)
Eosinophils Absolute: 0.1 10*3/uL (ref 0.0–0.7)
HCT: 31.4 % — ABNORMAL LOW (ref 36.0–46.0)
HEMOGLOBIN: 10.6 g/dL — AB (ref 12.0–15.0)
LYMPHS ABS: 0.7 10*3/uL (ref 0.7–4.0)
Lymphocytes Relative: 12 % (ref 12–46)
MCH: 38.8 pg — ABNORMAL HIGH (ref 26.0–34.0)
MCHC: 33.8 g/dL (ref 30.0–36.0)
MCV: 115 fL — ABNORMAL HIGH (ref 78.0–100.0)
Monocytes Absolute: 0.2 10*3/uL (ref 0.1–1.0)
Monocytes Relative: 4 % (ref 3–12)
NEUTROS ABS: 4.9 10*3/uL (ref 1.7–7.7)
Neutrophils Relative %: 83 % — ABNORMAL HIGH (ref 43–77)
Platelets: 108 10*3/uL — ABNORMAL LOW (ref 150–400)
RBC: 2.73 MIL/uL — AB (ref 3.87–5.11)
WBC: 5.9 10*3/uL (ref 4.0–10.5)

## 2015-02-11 LAB — COMPREHENSIVE METABOLIC PANEL
ALBUMIN: 2.4 g/dL — AB (ref 3.5–5.2)
ALT: 32 U/L (ref 0–35)
ANION GAP: 4 — AB (ref 5–15)
AST: 38 U/L — ABNORMAL HIGH (ref 0–37)
Alkaline Phosphatase: 85 U/L (ref 39–117)
BUN: 27 mg/dL — AB (ref 6–23)
CALCIUM: 8 mg/dL — AB (ref 8.4–10.5)
CO2: 27 mmol/L (ref 19–32)
CREATININE: 0.68 mg/dL (ref 0.50–1.10)
Chloride: 106 mmol/L (ref 96–112)
GFR calc Af Amer: >90 mL/min (ref >90)
GFR calc non Af Amer: >90 mL/min (ref >90)
Glucose, Bld: 93 mg/dL (ref 70–99)
Potassium: 3.6 mmol/L (ref 3.5–5.1)
Sodium: 137 mmol/L (ref 135–145)
TOTAL PROTEIN: 6.9 g/dL (ref 6.0–8.3)
Total Bilirubin: 3 mg/dL — ABNORMAL HIGH (ref 0.3–1.2)

## 2015-02-11 LAB — URINALYSIS, ROUTINE W REFLEX MICROSCOPIC
Bilirubin Urine: NEGATIVE
GLUCOSE, UA: NEGATIVE mg/dL
Ketones, ur: NEGATIVE mg/dL
Nitrite: NEGATIVE
Protein, ur: NEGATIVE mg/dL
SPECIFIC GRAVITY, URINE: 1.006 (ref 1.005–1.030)
UROBILINOGEN UA: 1 mg/dL (ref 0.0–1.0)
pH: 7 (ref 5.0–8.0)

## 2015-02-11 LAB — AMMONIA
AMMONIA: 55 umol/L — AB (ref 11–32)
Ammonia: 84 umol/L — ABNORMAL HIGH (ref 11–32)

## 2015-02-11 LAB — URINE MICROSCOPIC-ADD ON

## 2015-02-11 LAB — PROTIME-INR
INR: 2.42 — ABNORMAL HIGH (ref 0.00–1.49)
Prothrombin Time: 26.3 seconds — ABNORMAL HIGH (ref 11.6–15.2)

## 2015-02-11 LAB — CBG MONITORING, ED: Glucose-Capillary: 96 mg/dL (ref 70–99)

## 2015-02-11 LAB — I-STAT CG4 LACTIC ACID, ED: Lactic Acid, Venous: 1.8 mmol/L (ref 0.5–2.0)

## 2015-02-11 LAB — TROPONIN I

## 2015-02-11 MED ORDER — LACTULOSE 10 GM/15ML PO SOLN
30.0000 g | Freq: Once | ORAL | Status: AC
  Administered 2015-02-11: 30 g via ORAL

## 2015-02-11 MED ORDER — LACTULOSE 10 GM/15ML PO SOLN
ORAL | Status: AC
  Filled 2015-02-11: qty 30

## 2015-02-11 MED ORDER — FUROSEMIDE 10 MG/ML IJ SOLN
60.0000 mg | Freq: Once | INTRAMUSCULAR | Status: AC
  Administered 2015-02-11: 60 mg via INTRAVENOUS
  Filled 2015-02-11: qty 6

## 2015-02-11 MED ORDER — SODIUM CHLORIDE 0.9 % IV BOLUS (SEPSIS)
250.0000 mL | Freq: Once | INTRAVENOUS | Status: AC
  Administered 2015-02-11: 250 mL via INTRAVENOUS

## 2015-02-11 NOTE — ED Notes (Signed)
Pt sts she had a syncopal episode today and now feels "out of it".

## 2015-02-11 NOTE — ED Notes (Signed)
Pt resting/ sleeping, arousable, interactive, NAD, calm, interactive, follows commands, states, "feel about the same". Family at [facility].

## 2015-02-11 NOTE — ED Provider Notes (Signed)
CSN: [id number]     Arrival date & time 02/11/15  1858 History   First MD Initiated Contact with Patient 02/11/15 1917     Chief Complaint  Patient presents with  . Loss of Consciousness     (Consider location/radiation/quality/duration/timing/severity/associated sxs/prior Treatment) HPI Margaret [name] is a 40 y.o. female with history of autoimmune hepatitis, anticardiolipin antibody syndrome, on Coumadin, chronic pain, presents to emergency department for altered mental status. Most of the history provided by husband. He states that the patient took her son to school this morning and that is the last thing she remembers. Apparently husband spoke with her around 10:30 AM and reminded her of her appointment she had for 11:30. Patient missed her appointment. And never showed up to pick up her son from school. Patient has no recollection of that whole entire time period. Husband states that she is confused, repeating the same questions over and over. Patient recently admitted and treated at [facility] about a month ago for anasarca, lower abdominal cellulitis, liver and kidney failure. She states she was getting better, however she believes that her swelling in her legs and lower abdomen is increasing now. She is currently oriented but states she does not remember what happened this morning and why she did not make it to her appointment. She denies history of seizures. She has had syncopal episodes in the past. She had an appointment today with her primary care doctor and Dr. Marin Olp to check on the new red rash that she has been developing on her legs. He has been following her for low platelets. Pt denies head injury. No headache. No fever, chills.    Past Medical History  Diagnosis Date  . DVT (deep venous thrombosis) 1996, 2000    h/o  . GERD (gastroesophageal reflux disease)   . Obesity, morbid   . Anticardiolipin antibody syndrome   . Hepatitis, autoimmune   . Abnormal menses   . Sleep  apnea      mild to moderate  per apnea link y 3-12  . Chronic headaches   . Chronic lower back pain     Dr. Vira Blanco   Past Surgical History  Procedure Laterality Date  . Dilation and curettage of uterus  11/2008   Family History  Problem Relation Age of Onset  . Adopted: Yes   History  Substance Use Topics  . Smoking status: Former Smoker -- 0.25 packs/day for 16 years    Types: Cigarettes    Start date: 12/31/1996    Quit date: 06/29/2012  . Smokeless tobacco: Never Used     Comment: QUIT SMOKING 1.5 YEARS AGO  . Alcohol Use: Yes     Comment: occasionally-3 drinks per weekend   OB History    No data available     Review of Systems  Constitutional: Negative for fever and chills.  HENT: Negative.   Respiratory: Negative for cough, chest tightness and shortness of breath.   Cardiovascular: Positive for leg swelling. Negative for chest pain and palpitations.  Gastrointestinal: Positive for abdominal pain. Negative for nausea, vomiting and diarrhea.  Genitourinary: Negative for dysuria, flank pain and pelvic pain.  Musculoskeletal: Negative for myalgias, arthralgias, neck pain and neck stiffness.  Skin: Positive for rash.  Neurological: Positive for weakness. Negative for dizziness and headaches.  Psychiatric/Behavioral: Positive for confusion.  All other systems reviewed and are negative.     Allergies  Influenza vaccines  Home Medications   Prior to Admission medications   Medication Sig  Start Date End Date Taking? Authorizing Provider  azaTHIOprine (IMURAN) 50 MG tablet Take 50 mg by mouth daily. Takes 4 tablets daily.    Historical Provider, MD  budesonide (ENTOCORT EC) 3 MG 24 hr capsule Take 3 mg by mouth daily. Takes 3 tabs q AM = 9 mg    Historical Provider, MD  chlorzoxazone (PARAFON) 500 MG tablet Take by mouth 4 (four) times daily as needed for muscle spasms.    Historical Provider, MD  furosemide (LASIX) 40 MG tablet Take 40 mg by mouth daily. 01/08/15  01/08/16  Historical Provider, MD  Oxycodone HCl 10 MG TABS Take 15 mg by mouth 5 (five) times daily as needed. Pt takes 1 1/2 tabs  As needed    Historical Provider, MD  pantoprazole (PROTONIX) 40 MG tablet Take 40 mg by mouth daily. 01/08/15 01/08/16  Historical Provider, MD  Skin Protectants, Misc. (EUCERIN) cream Apply topically as needed. 01/08/15   Historical Provider, MD  Tapentadol HCl (NUCYNTA ER) 100 MG TB12 Take 100 mg by mouth 2 (two) times daily.    Historical Provider, MD  triamcinolone cream (KENALOG) 0.1 % Apply topically 2 (two) times daily as needed. 01/08/15 01/08/16  Historical Provider, MD  warfarin (COUMADIN) 5 MG tablet Take 1.5 to 2 tablets by mouth daily as directed by coumadin clinic 09/14/14   Colon Branch, MD   BP 142/84 mmHg  Pulse 109  Temp(Src) 98.7 F (37.1 C) (Oral)  Resp 20  Ht 4\' 11"  (1.499 m)  Wt 224 lb (101.606 kg)  BMI 45.22 kg/m2  SpO2 96% Physical Exam  Constitutional: She is oriented to person, place, and time. She appears well-developed and well-nourished.  Pt appears solmnolent  HENT:  Head: Normocephalic.  Eyes: Conjunctivae and EOM are normal. Pupils are equal, round, and reactive to light.  Neck: Normal range of motion. Neck supple.  Cardiovascular: Normal rate, regular rhythm and normal heart sounds.   Pulmonary/Chest: Effort normal and breath sounds normal. No respiratory distress. She has no wheezes. She has no rales.  Abdominal: Soft. Bowel sounds are normal. She exhibits no distension. There is no tenderness. There is no rebound and no guarding.  Edema up to the umbilicus, pitting, 2+. No evidence of cellulitis  Musculoskeletal:  3+ pitting edema of LE bilaterally  Neurological: She is alert and oriented to person, place, and time. No cranial nerve deficit. Coordination normal.  5/5 and equal upper and lower extremity strength bilaterally. Equal grip strength bilaterally. Normal finger to nose and heel to shin. No pronator drift.   Skin: Skin is  warm and dry.  Purpuric rash to bilateral anterior LE with surrounding erythema, ttp.   Psychiatric: She has a normal mood and affect. Her behavior is normal.  Nursing note and vitals reviewed.   ED Course  Procedures (including critical care time) Labs Review Labs Reviewed  CBC WITH DIFFERENTIAL/PLATELET - Abnormal; Notable for the following:    RBC 2.73 (*)    Hemoglobin 10.6 (*)    HCT 31.4 (*)    MCV 115.0 (*)    MCH 38.8 (*)    Platelets 108 (*)    Neutrophils Relative % 83 (*)    All other components within normal limits  COMPREHENSIVE METABOLIC PANEL - Abnormal; Notable for the following:    BUN 27 (*)    Calcium 8.0 (*)    Albumin 2.4 (*)    AST 38 (*)    Total Bilirubin 3.0 (*)    Anion gap  4 (*)    All other components within normal limits  PROTIME-INR - Abnormal; Notable for the following:    Prothrombin Time 26.3 (*)    INR 2.42 (*)    All other components within normal limits  AMMONIA - Abnormal; Notable for the following:    Ammonia 84 (*)    All other components within normal limits  URINALYSIS, ROUTINE W REFLEX MICROSCOPIC - Abnormal; Notable for the following:    Hgb urine dipstick TRACE (*)    Leukocytes, UA SMALL (*)    All other components within normal limits  URINE MICROSCOPIC-ADD ON - Abnormal; Notable for the following:    Bacteria, UA FEW (*)    All other components within normal limits  AMMONIA - Abnormal; Notable for the following:    Ammonia 55 (*)    All other components within normal limits  COMPREHENSIVE METABOLIC PANEL - Abnormal; Notable for the following:    Potassium 3.2 (*)    BUN 24 (*)    Calcium 7.7 (*)    Total Protein 5.7 (*)    Albumin 2.0 (*)    AST 40 (*)    Total Bilirubin 2.6 (*)    All other components within normal limits  TSH - Abnormal; Notable for the following:    TSH 4.690 (*)    All other components within normal limits  LACTATE DEHYDROGENASE - Abnormal; Notable for the following:    LDH 388 (*)    All  other components within normal limits  SEDIMENTATION RATE - Abnormal; Notable for the following:    Sed Rate 70 (*)    All other components within normal limits  PROTIME-INR - Abnormal; Notable for the following:    Prothrombin Time 24.3 (*)    INR 2.16 (*)    All other components within normal limits  TROPONIN I  PREGNANCY, URINE  ETHANOL  LACTIC ACID, PLASMA  LACTIC ACID, PLASMA  CBC WITH DIFFERENTIAL/PLATELET  URINE RAPID DRUG SCREEN (HOSP PERFORMED)  VITAMIN B12  C-REACTIVE PROTEIN  FOLATE  PROTIME-INR  CBC  COMPREHENSIVE METABOLIC PANEL  AMMONIA  CBG MONITORING, ED  I-STAT CG4 LACTIC ACID, ED    Imaging Review Dg Chest 2 View  02/11/2015   CLINICAL DATA:  Syncopal episode today.  Now feels out of it.  EXAM: CHEST  2 VIEW  COMPARISON:  CT chest 05/30/2009  FINDINGS: Shallow lung inflation. Heart size is accentuated by technique. The lungs are clear. No pulmonary edema.  IMPRESSION: 1. Shallow inflation. 2.  No evidence for acute  abnormality.   Electronically Signed   By: Nolon Nations M.D.   On: 02/11/2015 21:04   Ct Head Wo Contrast  02/11/2015   CLINICAL DATA:  Syncopal episode earlier today. Altered mental state.  EXAM: CT HEAD WITHOUT CONTRAST  TECHNIQUE: Contiguous axial images were obtained from the base of the skull through the vertex without intravenous contrast.  COMPARISON:  Brain MRI on 01/30/2014  FINDINGS: No evidence of intracranial hemorrhage, brain edema, or other signs of acute infarction. No evidence of intracranial mass lesion or mass effect.  No abnormal extraaxial fluid collections identified. Ventricles are normal in size. No skull abnormality identified.  IMPRESSION: Negative noncontrast head CT.   Electronically Signed   By: Earle Gell M.D.   On: 02/11/2015 22:15     EKG Interpretation   Date/Time:  Friday February 11 2015 19:35:09 EST Ventricular Rate:  101 PR Interval:  128 QRS Duration: 88 QT Interval:  370 QTC  Calculation: 479 R Axis:    76 Text Interpretation:  Sinus tachycardia Junctional ST depression, probably  normal No significant change since last tracing Confirmed by [facility]  MD,  Loree Fee ([id number]) on 02/11/2015 7:37:01 PM      MDM   Final diagnoses:  Altered mental state  Hepatic encephalopathy   Pt wth confusion, new onset today. Currently alert and oriented. Will check labs, CXR, ua. Will monitor.   Delay in obtaining labs due to lab issue with chemistry machine. Initial ammonia elevated however may be an error, will recheck. Otherwise, labs unremarkable. VS noraml. Pt's platelets and counts improved from last. LFTs oK, renal function with elevated BUN, creat normal at 0.68. DIscussed with dr. Maryan Rued who has seen pt. Pt continues to be solmnolent. Will add CT head, and admission.   Filed Vitals:   02/12/15 0307 02/12/15 0645 02/12/15 0937 02/12/15 2035  BP: 113/64 125/76 124/73 118/68  Pulse: 106 110 108 102  Temp: 98.7 F (37.1 C) 98.5 F (36.9 C) 98.5 F (36.9 C) 98.9 F (37.2 C)  TempSrc: Oral Oral Oral Oral  Resp: 18 18 18 17   Height:      Weight: 226 lb 3.1 oz (102.6 kg)   225 lb 15.5 oz (102.5 kg)  SpO2: 97% 96% 96% 93%         Renold Genta, PA-C 02/12/15 [place], MD 02/14/15 [phone]

## 2015-02-11 NOTE — ED Notes (Signed)
Patient transported to X-ray 

## 2015-02-11 NOTE — ED Notes (Signed)
Dr. Maryan Rued in to speak with pt/family.

## 2015-02-11 NOTE — ED Notes (Signed)
CBG is 96, Transfer not working on device

## 2015-02-11 NOTE — Telephone Encounter (Signed)
Pt stated she forgot about appointment today rescheduled for 2-15. RN aware

## 2015-02-11 NOTE — ED Notes (Signed)
Pt up to [facility], back to stretcher w/o incident or change. Resting/sleeping. Husband at [facility].

## 2015-02-11 NOTE — ED Provider Notes (Addendum)
Patient presents with greater than 12 hours of increased somnolence, confusion with a history of autoimmune hepatitis. Patient also has a significant history of anti-cardiolipin antibodies syndrome on chronic Coumadin. Labs show that patient has new elevated pneumonia and concern for hepatic encephalopathy on exam with asterixis. She has never had to be on lactulose in the past and was given a dose here. Patient states that she has been on oxycodone and new sent to for years and knows she did not take additional medication. Coumadin is therapeutic with an INR of 2.4.    Patient treated with lactulose and will admit to ensure mental status returns to normal prior to discharge home.  Blanchie Dessert, MD 02/11/15 2316  Blanchie Dessert, MD 02/11/15 2333

## 2015-02-11 NOTE — ED Notes (Signed)
Returned from xray

## 2015-02-12 ENCOUNTER — Inpatient Hospital Stay (HOSPITAL_COMMUNITY): Payer: 59

## 2015-02-12 ENCOUNTER — Encounter (HOSPITAL_COMMUNITY): Payer: Self-pay | Admitting: General Practice

## 2015-02-12 DIAGNOSIS — L03119 Cellulitis of unspecified part of limb: Secondary | ICD-10-CM

## 2015-02-12 DIAGNOSIS — D6861 Antiphospholipid syndrome: Secondary | ICD-10-CM

## 2015-02-12 DIAGNOSIS — K754 Autoimmune hepatitis: Secondary | ICD-10-CM

## 2015-02-12 DIAGNOSIS — K7290 Hepatic failure, unspecified without coma: Principal | ICD-10-CM

## 2015-02-12 DIAGNOSIS — D649 Anemia, unspecified: Secondary | ICD-10-CM

## 2015-02-12 LAB — SEDIMENTATION RATE: SED RATE: 70 mm/h — AB (ref 0–22)

## 2015-02-12 LAB — COMPREHENSIVE METABOLIC PANEL
ALK PHOS: 70 U/L (ref 39–117)
ALT: 29 U/L (ref 0–35)
AST: 40 U/L — ABNORMAL HIGH (ref 0–37)
Albumin: 2 g/dL — ABNORMAL LOW (ref 3.5–5.2)
Anion gap: 10 (ref 5–15)
BUN: 24 mg/dL — ABNORMAL HIGH (ref 6–23)
CHLORIDE: 107 mmol/L (ref 96–112)
CO2: 25 mmol/L (ref 19–32)
CREATININE: 0.74 mg/dL (ref 0.50–1.10)
Calcium: 7.7 mg/dL — ABNORMAL LOW (ref 8.4–10.5)
Glucose, Bld: 89 mg/dL (ref 70–99)
POTASSIUM: 3.2 mmol/L — AB (ref 3.5–5.1)
Sodium: 142 mmol/L (ref 135–145)
Total Bilirubin: 2.6 mg/dL — ABNORMAL HIGH (ref 0.3–1.2)
Total Protein: 5.7 g/dL — ABNORMAL LOW (ref 6.0–8.3)

## 2015-02-12 LAB — LACTATE DEHYDROGENASE: LDH: 388 U/L — AB (ref 94–250)

## 2015-02-12 LAB — PROTIME-INR
INR: 2.16 — ABNORMAL HIGH (ref 0.00–1.49)
Prothrombin Time: 24.3 seconds — ABNORMAL HIGH (ref 11.6–15.2)

## 2015-02-12 LAB — PREGNANCY, URINE: Preg Test, Ur: NEGATIVE

## 2015-02-12 LAB — TSH: TSH: 4.69 u[IU]/mL — ABNORMAL HIGH (ref 0.350–4.500)

## 2015-02-12 LAB — ETHANOL: Alcohol, Ethyl (B): <5 mg/dL (ref 0–9)

## 2015-02-12 MED ORDER — BUDESONIDE 3 MG PO CP24
9.0000 mg | ORAL_CAPSULE | Freq: Every day | ORAL | Status: DC
  Administered 2015-02-12 – 2015-02-13 (×2): 9 mg via ORAL
  Filled 2015-02-12 (×3): qty 3

## 2015-02-12 MED ORDER — OXYCODONE HCL 5 MG PO TABS
5.0000 mg | ORAL_TABLET | Freq: Four times a day (QID) | ORAL | Status: DC | PRN
  Administered 2015-02-12 (×2): 10 mg via ORAL
  Administered 2015-02-13: 5 mg via ORAL
  Filled 2015-02-12: qty 1
  Filled 2015-02-12 (×2): qty 2

## 2015-02-12 MED ORDER — LACTULOSE 10 GM/15ML PO SOLN
20.0000 g | Freq: Two times a day (BID) | ORAL | Status: DC
  Filled 2015-02-12: qty 30

## 2015-02-12 MED ORDER — PANTOPRAZOLE SODIUM 40 MG IV SOLR
40.0000 mg | Freq: Two times a day (BID) | INTRAVENOUS | Status: DC
  Administered 2015-02-12 – 2015-02-13 (×3): 40 mg via INTRAVENOUS
  Filled 2015-02-12 (×4): qty 40

## 2015-02-12 MED ORDER — WARFARIN SODIUM 7.5 MG PO TABS
7.5000 mg | ORAL_TABLET | ORAL | Status: DC
  Administered 2015-02-12: 7.5 mg via ORAL
  Filled 2015-02-12 (×2): qty 1

## 2015-02-12 MED ORDER — FUROSEMIDE 40 MG PO TABS
40.0000 mg | ORAL_TABLET | Freq: Every day | ORAL | Status: DC
  Administered 2015-02-12 – 2015-02-13 (×2): 40 mg via ORAL
  Filled 2015-02-12 (×2): qty 1

## 2015-02-12 MED ORDER — ONDANSETRON HCL 4 MG/2ML IJ SOLN
4.0000 mg | Freq: Once | INTRAMUSCULAR | Status: AC
  Administered 2015-02-12: 4 mg via INTRAVENOUS

## 2015-02-12 MED ORDER — ONDANSETRON HCL 4 MG/2ML IJ SOLN
INTRAMUSCULAR | Status: AC
  Administered 2015-02-12: 4 mg via INTRAVENOUS
  Filled 2015-02-12: qty 2

## 2015-02-12 MED ORDER — POTASSIUM CHLORIDE CRYS ER 20 MEQ PO TBCR
40.0000 meq | EXTENDED_RELEASE_TABLET | Freq: Two times a day (BID) | ORAL | Status: DC
  Administered 2015-02-12 – 2015-02-13 (×3): 40 meq via ORAL
  Filled 2015-02-12 (×4): qty 2

## 2015-02-12 MED ORDER — ONDANSETRON HCL 4 MG/2ML IJ SOLN: 4.0000 mg | Freq: Four times a day (QID) | INTRAMUSCULAR | Status: DC | PRN

## 2015-02-12 MED ORDER — VANCOMYCIN HCL IN DEXTROSE 1-5 GM/200ML-% IV SOLN
1000.0000 mg | Freq: Three times a day (TID) | INTRAVENOUS | Status: DC
  Administered 2015-02-12 – 2015-02-13 (×2): 1000 mg via INTRAVENOUS
  Filled 2015-02-12 (×4): qty 200

## 2015-02-12 MED ORDER — LACTULOSE 10 GM/15ML PO SOLN
20.0000 g | Freq: Three times a day (TID) | ORAL | Status: DC
  Administered 2015-02-12 – 2015-02-13 (×4): 20 g via ORAL
  Filled 2015-02-12 (×7): qty 30

## 2015-02-12 MED ORDER — CEFTRIAXONE SODIUM IN DEXTROSE 20 MG/ML IV SOLN
1.0000 g | INTRAVENOUS | Status: DC
  Administered 2015-02-12: 1 g via INTRAVENOUS
  Filled 2015-02-12 (×2): qty 50

## 2015-02-12 MED ORDER — SODIUM CHLORIDE 0.9 % IJ SOLN
3.0000 mL | Freq: Two times a day (BID) | INTRAMUSCULAR | Status: DC
  Administered 2015-02-12 – 2015-02-13 (×3): 3 mL via INTRAVENOUS

## 2015-02-12 MED ORDER — WARFARIN - PHARMACIST DOSING INPATIENT: Freq: Every day | Status: DC

## 2015-02-12 MED ORDER — WARFARIN SODIUM 10 MG PO TABS: 10.0000 mg | ORAL_TABLET | ORAL | Status: DC

## 2015-02-12 MED ORDER — LACTULOSE 10 GM/15ML PO SOLN
10.0000 g | Freq: Three times a day (TID) | ORAL | Status: DC
  Filled 2015-02-12 (×3): qty 15

## 2015-02-12 MED ORDER — ONDANSETRON HCL 4 MG PO TABS: 4.0000 mg | ORAL_TABLET | Freq: Four times a day (QID) | ORAL | Status: DC | PRN

## 2015-02-12 MED ORDER — AZATHIOPRINE 50 MG PO TABS
50.0000 mg | ORAL_TABLET | Freq: Every day | ORAL | Status: DC
  Administered 2015-02-12 – 2015-02-13 (×2): 50 mg via ORAL
  Filled 2015-02-12 (×2): qty 1

## 2015-02-12 NOTE — ED Notes (Signed)
[facility]link at [facility], pt c/o nausea.

## 2015-02-12 NOTE — Progress Notes (Signed)
Pt sat up to chair @ 2 hours.

## 2015-02-12 NOTE — Progress Notes (Addendum)
PATIENT DETAILS Name: Margaret [name] Age: 40 y.o. Sex: female Date of Birth: 08/04/1975 Admit Date: 02/11/2015 Admitting Physician Berle Mull, MD FFM:BWGY Paz, MD  Subjective: Awake, and alert compared to on admission. Per spouse, still sleepy and not yet back to baseline.  Assessment/Plan: Principal Problem:   Hepatic encephalopathy: Patient presented with increased somnolence and confusion. Has a history of autoimmune hepatitis and is on Imuran. She was found to have elevated ammonia level of 84 on admission. Started on lactulose, this morning, she is awake and alert and is responding appropriately to commands questions. Per husband, although mental status is improved, she is still somewhat lethargic and sleepy and not get back to her baseline. CT of the head is negative, remains afebrile with a negative UA and chest x-ray. Is on chronic narcotics with no changes in dosage recently. For now, since no other etiology then hepatic encephalopathy evident, will continue with lactulose and reassess tomorrow. I have encouraged patient to ambulate, asked RN to place patient bedside chair.  Active Problems:   ? Lower bilateral extremity cellulitis: suspect she has more of an eczema, however not able to rule out cellulitis at this time, stop ceftriaxone and start IV vancomycin.    History of autoimmune hepatitis with cirrhosis: Followed at [facility] transplant clinic. On Imuran and budesonide. Per family and patient, her abdomen is currently at baseline-she is obese. No evidence of obvious/significant ascites on exam. Reviewed notes in care everywhere, patient on 01/06/15 had a MRI of the abdomen at [facility] which showed only small volume ascites and choledocholithiasis. Given lack of any significant clinical findings including abdominal pain, doubt any further imaging is required at this time.    Chronic back pain: Continue oxycodone, resume long-acting narcotic in the next few days.     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 Coumadin per pharmacy while inpatient.  Disposition: Remain inpatient  Antibiotics:  See below   Anti-infectives    Start     Dose/Rate Route Frequency Ordered Stop   02/12/15 0500  cefTRIAXone (ROCEPHIN) 1 g in dextrose 5 % 50 mL IVPB - Premix     1 g 100 mL/hr over 30 Minutes Intravenous Every 24 hours 02/12/15 0410        DVT Prophylaxis: On Coumadin with therapeutic INR  Code Status: Full code   Family Communication Husband at bedside  Procedures:  None  CONSULTS:  None  Time spent 40 minutes-which includes 50% of the time with face-to-face with patient/ family and coordinating care related to the above assessment and plan.  MEDICATIONS: Scheduled Meds: . azaTHIOprine  50 mg Oral Daily  . budesonide  9 mg Oral Daily  . cefTRIAXone (ROCEPHIN)  IV  1 g Intravenous Q24H  . furosemide  40 mg Oral Daily  . lactulose  20 g Oral TID  . pantoprazole (PROTONIX) IV  40 mg Intravenous Q12H  . potassium chloride  40 mEq Oral BID  . sodium chloride  3 mL Intravenous Q12H  . [START ON 02/14/2015] warfarin  10 mg Oral Q M,W,F-1800  . warfarin  7.5 mg Oral Q T,Th,S,Su-1800  . Warfarin - Pharmacist Dosing Inpatient   Does not apply q1800   Continuous Infusions:  PRN Meds:.ondansetron **OR** ondansetron (ZOFRAN) IV, oxyCODONE    PHYSICAL EXAM: Vital signs in last 24 hours: Filed Vitals:   02/12/15 0200 02/12/15 0307 02/12/15 0645 02/12/15 0937  BP: 117/47  113/64 125/76 124/73  Pulse: 106 106 110 108  Temp:  98.7 F (37.1 C) 98.5 F (36.9 C) 98.5 F (36.9 C)  TempSrc:  Oral Oral Oral  Resp:  18 18 18   Height:      Weight:  102.6 kg (226 lb 3.1 oz)    SpO2: 94% 97% 96% 96%    Weight change:  Filed Weights   02/11/15 1909 02/12/15 0307  Weight: 101.606 kg (224 lb) 102.6 kg (226 lb 3.1 oz)   Body mass index is 45.66 kg/(m^2).   Gen Exam: Awake  and alert with clear speech.   Neck: Supple, No JVD.   Chest: B/L Clear.   CVS: S1 S2 Regular, no murmurs.  Abdomen: soft, BS +, non tender, obese, but not tense and no obvious evidence of ascites. Extremities: no edema, ? Patchy cellulitic changes in bilateral calf area. Neurologic: Non Focal.   Skin: No Rash.   Wounds: N/A.    Intake/Output from previous day:  Intake/Output Summary (Last 24 hours) at 02/12/15 1502 Last data filed at 02/12/15 0153  Gross per 24 hour  Intake    800 ml  Output    300 ml  Net    500 ml     LAB RESULTS: CBC  Recent Labs Lab 02/11/15 1940  WBC 5.9  HGB 10.6*  HCT 31.4*  PLT 108*  MCV 115.0*  MCH 38.8*  MCHC 33.8  LYMPHSABS 0.7  MONOABS 0.2  EOSABS 0.1  BASOSABS 0.0    Chemistries   Recent Labs Lab 02/11/15 1940 02/12/15 0647  NA 137 142  K 3.6 3.2*  CL 106 107  CO2 27 25  GLUCOSE 93 89  BUN 27* 24*  CREATININE 0.68 0.74  CALCIUM 8.0* 7.7*    CBG:  Recent Labs Lab 02/11/15 1925  GLUCAP 96    GFR Estimated Creatinine Clearance: 100.8 mL/min (by C-G formula based on Cr of 0.74).  Coagulation profile  Recent Labs Lab 02/11/15 1940 02/12/15 0647  INR 2.42* 2.16*    Cardiac Enzymes  Recent Labs Lab 02/11/15 1940  TROPONINI <0.03    Invalid input(s): POCBNP No results for input(s): DDIMER in the last 72 hours. No results for input(s): HGBA1C in the last 72 hours. No results for input(s): CHOL, HDL, LDLCALC, TRIG, CHOLHDL, LDLDIRECT in the last 72 hours.  Recent Labs  02/12/15 0647  TSH 4.690*   No results for input(s): VITAMINB12, FOLATE, FERRITIN, TIBC, IRON, RETICCTPCT in the last 72 hours. No results for input(s): LIPASE, AMYLASE in the last 72 hours.  Urine Studies No results for input(s): UHGB, CRYS in the last 72 hours.  Invalid input(s): UACOL, UAPR, USPG, UPH, UTP, UGL, UKET, UBIL, UNIT, UROB, ULEU, UEPI, UWBC, URBC, UBAC, CAST, UCOM, BILUA  MICROBIOLOGY: No results found for this or  any previous visit (from the past 240 hour(s)).  RADIOLOGY STUDIES/RESULTS: Dg Chest 2 View  02/11/2015   CLINICAL DATA:  Syncopal episode today.  Now feels out of it.  EXAM: CHEST  2 VIEW  COMPARISON:  CT chest 05/30/2009  FINDINGS: Shallow lung inflation. Heart size is accentuated by technique. The lungs are clear. No pulmonary edema.  IMPRESSION: 1. Shallow inflation. 2.  No evidence for acute  abnormality.   Electronically Signed   By: Nolon Nations M.D.   On: 02/11/2015 21:04   Ct Head Wo Contrast  02/11/2015   CLINICAL DATA:  Syncopal episode earlier today. Altered mental state.  EXAM: CT HEAD WITHOUT CONTRAST  TECHNIQUE: Contiguous axial images were obtained from the base of the skull through the vertex without intravenous contrast.  COMPARISON:  Brain MRI on 01/30/2014  FINDINGS: No evidence of intracranial hemorrhage, brain edema, or other signs of acute infarction. No evidence of intracranial mass lesion or mass effect.  No abnormal extraaxial fluid collections identified. Ventricles are normal in size. No skull abnormality identified.  IMPRESSION: Negative noncontrast head CT.   Electronically Signed   By: Earle Gell M.D.   On: 02/11/2015 22:15    Oren Binet, MD  [facility] Pager:[phone]  If 7PM-7AM, please contact night-coverage [email or web address] Password [email or web address] 02/12/2015, 3:02 PM   LOS: 1 day

## 2015-02-12 NOTE — Progress Notes (Signed)
Paged Dr. Sloan Leiter, need indication other than ascites for complete ABD.

## 2015-02-12 NOTE — Progress Notes (Signed)
ANTICOAGULATION CONSULT NOTE - Initial Consult  Pharmacy Consult for Coumadin Indication: h/o DVT  Allergies  Allergen Reactions  . Influenza Vaccines     "Got sick x months"    Patient Measurements: Height: 4\' 11"  (149.9 cm) Weight: 226 lb 3.1 oz (102.6 kg) IBW/kg (Calculated) : 43.2  Vital Signs: Temp: 98.7 F (37.1 C) (02/13 0307) Temp Source: Oral (02/13 0307) BP: 113/64 mmHg (02/13 0307) Pulse Rate: 106 (02/13 0307)  Labs:  Recent Labs  02/11/15 1940  HGB 10.6*  HCT 31.4*  PLT 108*  LABPROT 26.3*  INR 2.42*  CREATININE 0.68  TROPONINI <0.03    Estimated Creatinine Clearance: 100.8 mL/min (by C-G formula based on Cr of 0.68).   Medical History: Past Medical History  Diagnosis Date  . DVT (deep venous thrombosis) 1996, 2000    h/o  . GERD (gastroesophageal reflux disease)   . Obesity, morbid   . Anticardiolipin antibody syndrome   . Hepatitis, autoimmune   . Abnormal menses   . Sleep apnea      mild to moderate  per apnea link y 3-12  . Chronic headaches   . Chronic lower back pain     Dr. Vira Blanco    Medications:  Prescriptions prior to admission  Medication Sig Dispense Refill Last Dose  . azaTHIOprine (IMURAN) 50 MG tablet Take 50 mg by mouth daily. Takes 4 tablets daily.   Taking  . budesonide (ENTOCORT EC) 3 MG 24 hr capsule Take 3 mg by mouth daily. Takes 3 tabs q AM = 9 mg   Taking  . chlorzoxazone (PARAFON) 500 MG tablet Take by mouth 4 (four) times daily as needed for muscle spasms.   Taking  . furosemide (LASIX) 40 MG tablet Take 40 mg by mouth daily.   Taking  . Oxycodone HCl 10 MG TABS Take 15 mg by mouth 5 (five) times daily as needed. Pt takes 1 1/2 tabs  As needed   Taking  . pantoprazole (PROTONIX) 40 MG tablet Take 40 mg by mouth daily.   Taking  . Skin Protectants, Misc. (EUCERIN) cream Apply topically as needed.   Taking  . Tapentadol HCl (NUCYNTA ER) 100 MG TB12 Take 100 mg by mouth 2 (two) times daily.   Taking  . triamcinolone  cream (KENALOG) 0.1 % Apply topically 2 (two) times daily as needed.   Taking  . warfarin (COUMADIN) 5 MG tablet Take 1.5 to 2 tablets by mouth daily as directed by coumadin clinic 50 tablet 5 Taking   Scheduled:  . azaTHIOprine  50 mg Oral Daily  . budesonide  9 mg Oral Daily  . cefTRIAXone (ROCEPHIN)  IV  1 g Intravenous Q24H  . furosemide  40 mg Oral Daily  . lactulose  10 g Oral TID  . pantoprazole (PROTONIX) IV  40 mg Intravenous Q12H  . sodium chloride  3 mL Intravenous Q12H    Assessment: 40yo female c/o syncopal event, noted w/ elevated ammonia and concern for hepatic encephalopathy, to be admitted until back to baseline, to continue Coumadin for h/o DVT; current INR therapeutic.  Goal of Therapy:  INR 2-3   Plan:  Will continue home Coumadin dose of 7.5mg  TTSS and 10mg  MWF and monitor INR for dose adjustments.  Wynona Neat, PharmD, BCPS  02/12/2015,6:36 AM

## 2015-02-12 NOTE — Progress Notes (Signed)
Pt requests not to wear tele monitor, paged Dr. Altamease Oiler and may we dc

## 2015-02-12 NOTE — ED Notes (Signed)
No changes. Alert, NAD, calm, interactive, med given for nausea, out with [facility], husband present.

## 2015-02-12 NOTE — Progress Notes (Signed)
ANTIBIOTIC CONSULT NOTE - INITIAL  Pharmacy Consult for Vancomycin Indication: cellulitis  Allergies  Allergen Reactions  . Influenza Vaccines     "Got sick x months"    Patient Measurements: Height: 4\' 11"  (149.9 cm) Weight: 226 lb 3.1 oz (102.6 kg) IBW/kg (Calculated) : 43.2  Vital Signs: Temp: 98.5 F (36.9 C) (02/13 0937) Temp Source: Oral (02/13 0937) BP: 124/73 mmHg (02/13 0937) Pulse Rate: 108 (02/13 0937) Intake/Output from previous day: 02/12 0701 - 02/13 0700 In: 800 [I.V.:800] Out: 300 [Urine:300] Intake/Output from this shift:    Labs:  Recent Labs  02/11/15 1940 02/12/15 0647  WBC 5.9  --   HGB 10.6*  --   PLT 108*  --   CREATININE 0.68 0.74   Estimated Creatinine Clearance: 100.8 mL/min (by C-G formula based on Cr of 0.74). No results for input(s): VANCOTROUGH, VANCOPEAK, VANCORANDOM, GENTTROUGH, GENTPEAK, GENTRANDOM, TOBRATROUGH, TOBRAPEAK, TOBRARND, AMIKACINPEAK, AMIKACINTROU, AMIKACIN in the last 72 hours.   Microbiology: No results found for this or any previous visit (from the past 720 hour(s)).  Medical History: Past Medical History  Diagnosis Date  . DVT (deep venous thrombosis) 1996, 2000    h/o  . GERD (gastroesophageal reflux disease)   . Obesity, morbid   . Anticardiolipin antibody syndrome   . Hepatitis, autoimmune   . Abnormal menses   . Sleep apnea      mild to moderate  per apnea link y 3-12  . Chronic headaches   . Chronic lower back pain     Dr. Vira Blanco    Assessment: 40 year old female starting vancomycin for cellulitis  Goal of Therapy:  Vancomycin trough level 10-15 mcg/ml  Plan:  Vancomycin 1 gram iv Q 8 hours Follow up Scr, fever trend, cultures  Thank you. Anette Guarneri, PharmD [phone]  Tad Moore 02/12/2015,3:48 PM

## 2015-02-12 NOTE — H&P (Signed)
[facility] History and Physical  Patient: Margaret [name]  MRN: [id number]  DOB: 11-27-1975  DOS: the patient was seen and examined on 02/12/2015 PCP: Kathlene November, MD  Chief Complaint: Confusion  HPI: Margaret [name] is a 40 y.o. female with Past medical history of autoimmune hepatitis, anticardiolipin antibody syndrome with recurrent DVT, GERD, sleep apnea, chronic lower back pain, on Coumadin, recent admission at [facility] for cellulitis and anasarca. The patient presented with complaints of confusion. History was obtained from ED documentation and the patient with the patient is poor historian due to confusion. The patient has history of autoimmune hepatitis as well as chronic, cytopenia and was supposed to see hematology on 02/11/2015 but forgot to maintain that appointment. She also forgot to pick her son up from the school. She does not remember the event and when the husband received her at home he found her confused and increasingly sleepy and drowsy and lethargic. Patient was recently found to have rash on her lower extremity for which she was supposed to see her PCP as well. Patient is unclear of any new medication that she is taking or any new chemical that she is using. The rash is painful no itching. No fever no chills no headache no focal deficit no head injury no neck injury reported. No incontinence of bowel or bladder reported. Patient and her husband reportedly denied a shoe and using excessive pain medication. Patient mentions she has been having some abdominal pain at the time of my evaluation which is in central abdomen. She feels her abdomen may be distended. She also has chronic leg swelling which she feels is worsening.  The patient is coming from home. And at her baseline independent for most of her ADL.  Review of Systems: as mentioned in the history of present illness.  A Comprehensive review of the other systems is negative.  Past Medical History  Diagnosis Date   . DVT (deep venous thrombosis) 1996, 2000    h/o  . GERD (gastroesophageal reflux disease)   . Obesity, morbid   . Anticardiolipin antibody syndrome   . Hepatitis, autoimmune   . Abnormal menses   . Sleep apnea      mild to moderate  per apnea link y 3-12  . Chronic headaches   . Chronic lower back pain     Dr. Vira Blanco   Past Surgical History  Procedure Laterality Date  . Dilation and curettage of uterus  11/2008   Social History:  reports that she quit smoking about 2 years ago. Her smoking use included Cigarettes. She started smoking about 18 years ago. She has a 4 pack-year smoking history. She has never used smokeless tobacco. She reports that she drinks alcohol. She reports that she does not use illicit drugs.  Allergies  Allergen Reactions  . Influenza Vaccines     "Got sick x months"    Family History  Problem Relation Age of Onset  . Adopted: Yes    Prior to Admission medications   Medication Sig Start Date End Date Taking? Authorizing Provider  azaTHIOprine (IMURAN) 50 MG tablet Take 50 mg by mouth daily. Takes 4 tablets daily.    Historical Provider, MD  budesonide (ENTOCORT EC) 3 MG 24 hr capsule Take 3 mg by mouth daily. Takes 3 tabs q AM = 9 mg    Historical Provider, MD  chlorzoxazone (PARAFON) 500 MG tablet Take by mouth 4 (four) times daily as needed for muscle spasms.    Historical Provider,  MD  furosemide (LASIX) 40 MG tablet Take 40 mg by mouth daily. 01/08/15 01/08/16  Historical Provider, MD  Oxycodone HCl 10 MG TABS Take 15 mg by mouth 5 (five) times daily as needed. Pt takes 1 1/2 tabs  As needed    Historical Provider, MD  pantoprazole (PROTONIX) 40 MG tablet Take 40 mg by mouth daily. 01/08/15 01/08/16  Historical Provider, MD  Skin Protectants, Misc. (EUCERIN) cream Apply topically as needed. 01/08/15   Historical Provider, MD  Tapentadol HCl (NUCYNTA ER) 100 MG TB12 Take 100 mg by mouth 2 (two) times daily.    Historical Provider, MD  triamcinolone cream  (KENALOG) 0.1 % Apply topically 2 (two) times daily as needed. 01/08/15 01/08/16  Historical Provider, MD  warfarin (COUMADIN) 5 MG tablet Take 1.5 to 2 tablets by mouth daily as directed by coumadin clinic 09/14/14   Colon Branch, MD    Physical Exam: Filed Vitals:   02/12/15 0100 02/12/15 0130 02/12/15 0200 02/12/15 0307  BP: 132/78 121/71 117/47 113/64  Pulse: 100 99 106 106  Temp:    98.7 F (37.1 C)  TempSrc:    Oral  Resp:    18  Height:      Weight:    102.6 kg (226 lb 3.1 oz)  SpO2: 95% 95% 94% 97%    General: Alert, Awake and Oriented to Time, Place and Person. Appear in mild distress Eyes: PERRL ENT: Oral Mucosa clear moist. Neck: no JVD Cardiovascular: S1 and S2 Present, aortic systolic Murmur, Peripheral Pulses Present Respiratory: Bilateral Air entry equal and Decreased, Clear to Auscultation, noCrackles, no wheezes Abdomen: Bowel Sound present sluggish, Soft and and diffuse central tender  Skin: Reveals maculopapular rash on the lower extremity bilaterally with warmth and tenderness Extremities: Bilateral Pedal edema, no calf tenderness Neurologic: Grossly no focal neuro deficit other than generalized lethargy and asterixis present with past pointing bilaterally   Labs on Admission:  CBC:  Recent Labs Lab 02/11/15 1940  WBC 5.9  NEUTROABS 4.9  HGB 10.6*  HCT 31.4*  MCV 115.0*  PLT 108*    CMP     Component Value Date/Time   NA 137 02/11/2015 1940   NA 135 12/15/2014 1412   K 3.6 02/11/2015 1940   K 3.6 12/15/2014 1412   CL 106 02/11/2015 1940   CL 106 12/15/2014 1412   CO2 27 02/11/2015 1940   CO2 26 12/15/2014 1412   GLUCOSE 93 02/11/2015 1940   GLUCOSE 109 12/15/2014 1412   BUN 27* 02/11/2015 1940   BUN 12 12/15/2014 1412   CREATININE 0.68 02/11/2015 1940   CREATININE 0.6 12/15/2014 1412   CALCIUM 8.0* 02/11/2015 1940   CALCIUM 7.3* 12/15/2014 1412   PROT 6.9 02/11/2015 1940   PROT 7.8 12/15/2014 1412   ALBUMIN 2.4* 02/11/2015 1940   AST 38*  02/11/2015 1940   AST 122* 12/15/2014 1412   ALT 32 02/11/2015 1940   ALT 99* 12/15/2014 1412   ALKPHOS 85 02/11/2015 1940   ALKPHOS 82 12/15/2014 1412   BILITOT 3.0* 02/11/2015 1940   BILITOT 1.20 12/15/2014 1412   GFRNONAA >90 02/11/2015 1940   GFRAA >90 02/11/2015 1940    No results for input(s): LIPASE, AMYLASE in the last 168 hours.   Recent Labs Lab 02/11/15 1940  TROPONINI <0.03   BNP (last 3 results) No results for input(s): BNP in the last 8760 hours.  ProBNP (last 3 results) No results for input(s): PROBNP in the last 8760 hours.  Radiological Exams on Admission: Dg Chest 2 View  02/11/2015   CLINICAL DATA:  Syncopal episode today.  Now feels out of it.  EXAM: CHEST  2 VIEW  COMPARISON:  CT chest 05/30/2009  FINDINGS: Shallow lung inflation. Heart size is accentuated by technique. The lungs are clear. No pulmonary edema.  IMPRESSION: 1. Shallow inflation. 2.  No evidence for acute  abnormality.   Electronically Signed   By: Nolon Nations M.D.   On: 02/11/2015 21:04   Ct Head Wo Contrast  02/11/2015   CLINICAL DATA:  Syncopal episode earlier today. Altered mental state.  EXAM: CT HEAD WITHOUT CONTRAST  TECHNIQUE: Contiguous axial images were obtained from the base of the skull through the vertex without intravenous contrast.  COMPARISON:  Brain MRI on 01/30/2014  FINDINGS: No evidence of intracranial hemorrhage, brain edema, or other signs of acute infarction. No evidence of intracranial mass lesion or mass effect.  No abnormal extraaxial fluid collections identified. Ventricles are normal in size. No skull abnormality identified.  IMPRESSION: Negative noncontrast head CT.   Electronically Signed   By: Earle Gell M.D.   On: 02/11/2015 22:15    EKG: Independently reviewed. normal sinus rhythm, nonspecific ST and T waves changes.  Assessment/Plan Principal Problem:   Hepatic encephalopathy Active Problems:   Anemia   Anticardiolipin syndrome   Hepatitis,  autoimmune   Cellulitis   1. Hepatic encephalopathy The patient is presenting with complaints of confusion. Further workup shows increased levels of ammonia. She has asterixis present. With most likely possible etiologies hepatic encephalopathy. Patient denies any active bleeding. Patient complains of some abdominal pain. We will get ultrasound and FOBT. She'll be placed on ceftriaxone. Continue lactulose. I would also check TSH and R-67 folic acid level to rule out any other possible etiology. Reducing her narcotics from ox he 15 mg to off see 5-10 mg, and reducing the frequency 5 times daily to every 6 hours.  2.Anticardiolipin syndrome. Autoimmune hepatitis.  Continue with home medication. Continue Lasix and azathioprine.  3.Possible cellulitis of the bilateral lower extremity. Check ESR CRP. Continue with ceftriaxone.  4. History of GERD. Continue PPI  Advance goals of care discussion: full code    DVT Prophylaxis: on chronic anticoagulation Nutrition: Nothing by mouth except medication   Disposition: Admitted to inpatient in med surge unit.  Author: Berle Mull, MD [facility] Pager: [phone] 02/12/2015, 6:35 AM    If 7PM-7AM, please contact night-coverage [email or web address] Password [email or web address]

## 2015-02-13 LAB — COMPREHENSIVE METABOLIC PANEL
ALBUMIN: 2 g/dL — AB (ref 3.5–5.2)
ALK PHOS: 75 U/L (ref 39–117)
ALT: 30 U/L (ref 0–35)
AST: 34 U/L (ref 0–37)
Anion gap: 5 (ref 5–15)
BUN: 23 mg/dL (ref 6–23)
CO2: 26 mmol/L (ref 19–32)
Calcium: 8.2 mg/dL — ABNORMAL LOW (ref 8.4–10.5)
Chloride: 109 mmol/L (ref 96–112)
Creatinine, Ser: 0.78 mg/dL (ref 0.50–1.10)
GFR calc non Af Amer: >90 mL/min (ref >90)
GLUCOSE: 117 mg/dL — AB (ref 70–99)
POTASSIUM: 3.5 mmol/L (ref 3.5–5.1)
SODIUM: 140 mmol/L (ref 135–145)
Total Bilirubin: 2.3 mg/dL — ABNORMAL HIGH (ref 0.3–1.2)
Total Protein: 6.3 g/dL (ref 6.0–8.3)

## 2015-02-13 LAB — CBC
HCT: 44 % (ref 36.0–46.0)
HEMOGLOBIN: 15 g/dL (ref 12.0–15.0)
MCH: 39.2 pg — AB (ref 26.0–34.0)
MCHC: 34.1 g/dL (ref 30.0–36.0)
MCV: 114.9 fL — ABNORMAL HIGH (ref 78.0–100.0)
Platelets: 81 10*3/uL — ABNORMAL LOW (ref 150–400)
RBC: 3.83 MIL/uL — AB (ref 3.87–5.11)
RDW: 31.5 % — ABNORMAL HIGH (ref 11.5–15.5)
WBC: 3.3 10*3/uL — AB (ref 4.0–10.5)

## 2015-02-13 LAB — AMMONIA: Ammonia: 38 umol/L — ABNORMAL HIGH (ref 11–32)

## 2015-02-13 LAB — FOLATE: FOLATE: 11.1 ng/mL

## 2015-02-13 LAB — C-REACTIVE PROTEIN: CRP: 5 mg/dL — AB (ref <0.60)

## 2015-02-13 LAB — PROTIME-INR
INR: 2.07 — AB (ref 0.00–1.49)
PROTHROMBIN TIME: 23.5 s — AB (ref 11.6–15.2)

## 2015-02-13 LAB — VITAMIN B12: VITAMIN B 12: 964 pg/mL — AB (ref 211–911)

## 2015-02-13 MED ORDER — LACTULOSE 10 GM/15ML PO SOLN: 20.0000 g | Freq: Two times a day (BID) | ORAL | Status: AC

## 2015-02-13 MED ORDER — DOXYCYCLINE HYCLATE 50 MG PO CAPS: 100.0000 mg | ORAL_CAPSULE | Freq: Two times a day (BID) | ORAL | Status: DC

## 2015-02-13 MED ORDER — RIFAXIMIN 550 MG PO TABS: 550.0000 mg | ORAL_TABLET | Freq: Two times a day (BID) | ORAL | Status: AC

## 2015-02-13 NOTE — Discharge Summary (Signed)
PATIENT DETAILS Name: Margaret [name] Age: 40 y.o. Sex: female Date of Birth: 19-Jan-1975 MRN: [id number]. Admitting Physician: Berle Mull, MD ERX:VQMG Larose Kells, MD  Admit Date: 02/11/2015 Discharge date: 02/13/2015  Recommendations for Outpatient Follow-up:  1. New medication: Xifaxan and lactulose for hepatic encephalopathy  PRIMARY DISCHARGE DIAGNOSIS:  Principal Problem:   Hepatic encephalopathy Active Problems:   Anemia   Anticardiolipin syndrome   Hepatitis, autoimmune   Cellulitis      PAST MEDICAL HISTORY: Past Medical History  Diagnosis Date  . DVT (deep venous thrombosis) 1996, 2000    h/o  . GERD (gastroesophageal reflux disease)   . Obesity, morbid   . Anticardiolipin antibody syndrome   . Hepatitis, autoimmune   . Abnormal menses   . Sleep apnea      mild to moderate  per apnea link y 3-12  . Chronic headaches   . Chronic lower back pain     Dr. Vira Blanco    DISCHARGE MEDICATIONS: Current Discharge Medication List    START taking these medications   Details  doxycycline (VIBRAMYCIN) 50 MG capsule Take 2 capsules (100 mg total) by mouth 2 (two) times daily. Qty: 20 capsule, Refills: 0    lactulose (CHRONULAC) 10 GM/15ML solution Take 30 mLs (20 g total) by mouth 2 (two) times daily. Titrate to have around 2 bowel movements a day. Qty: 240 mL, Refills: 0    rifaximin (XIFAXAN) 550 MG TABS tablet Take 1 tablet (550 mg total) by mouth 2 (two) times daily. Qty: 60 tablet, Refills: 0      CONTINUE these medications which have NOT CHANGED   Details  azaTHIOprine (IMURAN) 50 MG tablet Take 50 mg by mouth daily. Takes 4 tablets daily.    budesonide (ENTOCORT EC) 3 MG 24 hr capsule Take 3 mg by mouth daily. Takes 3 tabs q AM = 9 mg   Associated Diagnoses: Thrombocytopenia, unspecified    chlorzoxazone (PARAFON) 500 MG tablet Take by mouth 4 (four) times daily as needed for muscle spasms.   Associated Diagnoses: Thrombocytopenia, unspecified    furosemide  (LASIX) 40 MG tablet Take 40 mg by mouth daily.    Multiple Vitamins-Minerals (ZINC PO) Take 1 capsule by mouth daily.    Oxycodone HCl 10 MG TABS Take 15 mg by mouth 5 (five) times daily as needed. Pt takes 1 1/2 tabs  As needed   Associated Diagnoses: Thrombocytopenia, unspecified    pantoprazole (PROTONIX) 40 MG tablet Take 40 mg by mouth daily.    Skin Protectants, Misc. (EUCERIN) cream Apply topically as needed.    Tapentadol HCl (NUCYNTA ER) 100 MG TB12 Take 100 mg by mouth 2 (two) times daily.    triamcinolone cream (KENALOG) 0.1 % Apply topically 2 (two) times daily as needed.    warfarin (COUMADIN) 5 MG tablet Take 1.5 to 2 tablets by mouth daily as directed by coumadin clinic Qty: 50 tablet, Refills: 5        ALLERGIES:   Allergies  Allergen Reactions  . Influenza Vaccines     "Got sick x months"    BRIEF HPI:  See H&P, Labs, Consult and Test reports for all details in brief, patient was admitted for evaluation of somnolence, confusion. She has a history of autoimmune hepatitis.  CONSULTATIONS:   None  PERTINENT RADIOLOGIC STUDIES: Dg Chest 2 View  02/11/2015   CLINICAL DATA:  Syncopal episode today.  Now feels out of it.  EXAM: CHEST  2 VIEW  COMPARISON:  CT  chest 05/30/2009  FINDINGS: Shallow lung inflation. Heart size is accentuated by technique. The lungs are clear. No pulmonary edema.  IMPRESSION: 1. Shallow inflation. 2.  No evidence for acute  abnormality.   Electronically Signed   By: Nolon Nations M.D.   On: 02/11/2015 21:04   Ct Head Wo Contrast  02/11/2015   CLINICAL DATA:  Syncopal episode earlier today. Altered mental state.  EXAM: CT HEAD WITHOUT CONTRAST  TECHNIQUE: Contiguous axial images were obtained from the base of the skull through the vertex without intravenous contrast.  COMPARISON:  Brain MRI on 01/30/2014  FINDINGS: No evidence of intracranial hemorrhage, brain edema, or other signs of acute infarction. No evidence of intracranial mass  lesion or mass effect.  No abnormal extraaxial fluid collections identified. Ventricles are normal in size. No skull abnormality identified.  IMPRESSION: Negative noncontrast head CT.   Electronically Signed   By: Earle Gell M.D.   On: 02/11/2015 22:15     PERTINENT LAB RESULTS: CBC:  Recent Labs  02/11/15 1940 02/13/15 0715  WBC 5.9 3.3*  HGB 10.6* 15.0  HCT 31.4* 44.0  PLT 108* 81*   CMET CMP     Component Value Date/Time   NA 140 02/13/2015 0715   NA 135 12/15/2014 1412   K 3.5 02/13/2015 0715   K 3.6 12/15/2014 1412   CL 109 02/13/2015 0715   CL 106 12/15/2014 1412   CO2 26 02/13/2015 0715   CO2 26 12/15/2014 1412   GLUCOSE 117* 02/13/2015 0715   GLUCOSE 109 12/15/2014 1412   BUN 23 02/13/2015 0715   BUN 12 12/15/2014 1412   CREATININE 0.78 02/13/2015 0715   CREATININE 0.6 12/15/2014 1412   CALCIUM 8.2* 02/13/2015 0715   CALCIUM 7.3* 12/15/2014 1412   PROT 6.3 02/13/2015 0715   PROT 7.8 12/15/2014 1412   ALBUMIN 2.0* 02/13/2015 0715   AST 34 02/13/2015 0715   AST 122* 12/15/2014 1412   ALT 30 02/13/2015 0715   ALT 99* 12/15/2014 1412   ALKPHOS 75 02/13/2015 0715   ALKPHOS 82 12/15/2014 1412   BILITOT 2.3* 02/13/2015 0715   BILITOT 1.20 12/15/2014 1412   GFRNONAA >90 02/13/2015 0715   GFRAA >90 02/13/2015 0715    GFR Estimated Creatinine Clearance: 100.7 mL/min (by C-G formula based on Cr of 0.78). No results for input(s): LIPASE, AMYLASE in the last 72 hours.  Recent Labs  02/11/15 1940  TROPONINI <0.03   Invalid input(s): POCBNP No results for input(s): DDIMER in the last 72 hours. No results for input(s): HGBA1C in the last 72 hours. No results for input(s): CHOL, HDL, LDLCALC, TRIG, CHOLHDL, LDLDIRECT in the last 72 hours.  Recent Labs  02/12/15 0647  TSH 4.690*   No results for input(s): VITAMINB12, FOLATE, FERRITIN, TIBC, IRON, RETICCTPCT in the last 72 hours. Coags:  Recent Labs  02/12/15 0647 02/13/15 0715  INR 2.16* 2.07*    Microbiology: No results found for this or any previous visit (from the past 240 hour(s)).   BRIEF HOSPITAL COURSE:  Hepatic encephalopathy: Patient presented with increased somnolence and confusion. Has a history of autoimmune hepatitis and is on Imuran. She was found to have elevated ammonia level of 84 on admission. Started on lactulose, this morning, she is now completely awake and alert, per spouse she is back to her baseline. Since encephalopathy seems to have resolved, will discharge home on lactulose and Xifaxan. I have asked the patient and spouse to titrate lactulose to have at least 2  bowel movements a day. Patient instructed to follow-up with the hepatologist at [facility].   Active Problems:  ? Lower bilateral extremity cellulitis: suspect she has more of an eczema, however not able to rule out cellulitis at this time, started on IV vancomycin, both her lower extremities have less erythema today compared to yesterday. Transitioned to doxycycline for a few more days on discharge.    History of autoimmune hepatitis with cirrhosis: Followed at [facility] transplant clinic. On Imuran and budesonide. Per family and patient, her abdomen is currently at baseline-she is obese. No evidence of obvious/significant ascites on exam. Reviewed notes in care everywhere, patient on 01/06/15 had a MRI of the abdomen at [facility] which showed only small volume ascites and choledocholithiasis. Given lack of any significant clinical findings including abdominal pain, doubt any further imaging is required at this time.   Chronic back pain: Continue oxycodone, resume long-acting narcotic in the next few days.   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   TODAY-DAY OF DISCHARGE:  Subjective:   Lus Stegman today has no headache,no chest abdominal pain,no new weakness tingling or numbness, feels much better wants to go home today. Per  spouse, she is back to baseline.  Objective:   Blood pressure 132/92, pulse 88, temperature 98 F (36.7 C), temperature source Oral, resp. rate 17, height 4\' 11"  (1.499 m), weight 102.5 kg (225 lb 15.5 oz), SpO2 97 %.  Intake/Output Summary (Last 24 hours) at 02/13/15 1047 Last data filed at 02/13/15 0848  Gross per 24 hour  Intake    120 ml  Output      0 ml  Net    120 ml   Filed Weights   02/11/15 1909 02/12/15 0307 02/12/15 2035  Weight: 101.606 kg (224 lb) 102.6 kg (226 lb 3.1 oz) 102.5 kg (225 lb 15.5 oz)    Exam Gen Exam: Awake and alert with clear speech.  Neck: Supple, No JVD.  Chest: B/L Clear.  CVS: S1 S2 Regular, no murmurs.  Abdomen: soft, BS +, non tender, obese, but not tense and no obvious evidence of ascites. Extremities: no edema, ? Patchy but very minimal cellulitic changes in bilateral calf area. Neurologic: Non Focal.  Skin: No Rash.  Wounds: N/A.   DISCHARGE CONDITION: Stable  DISPOSITION: Home  DISCHARGE INSTRUCTIONS:    Activity:  As tolerated  Diet recommendation: Heart Healthy diet  Discharge Instructions    Call MD for:  extreme fatigue    Complete by:  As directed      Call MD for:    Complete by:  As directed   CONFUSION     Diet - low sodium heart healthy    Complete by:  As directed      Increase activity slowly    Complete by:  As directed            Follow-up Information    Follow up with Kathlene November, MD. Schedule an appointment as soon as possible for a visit in 1 week.   Specialty:  Internal Medicine   Contact information:   [place] [address] [phone]       Follow up with HAYASHI,PAUL, MD. Schedule an appointment as soon as possible for a visit in 2 weeks.   Specialty:  Gastroenterology   Contact information:   [address], [place] [address] [phone]       Total Time  spent on discharge equals 45  minutes.  SignedOren Binet 02/13/2015 10:47 AM

## 2015-02-13 NOTE — Progress Notes (Signed)
PATIENT DETAILS Name: Margaret [name] Age: 40 y.o. Sex: female Date of Birth: 05-13-1975 Admit Date: 02/11/2015 Admitting Physician Berle Mull, MD WUJ:WJXB Paz, MD  Subjective: Completely awake alert, back to baseline-spouse at bedside. She is ambulating in the room, in fact had a shower this morning.  Assessment/Plan: Principal Problem:   Hepatic encephalopathy: Patient presented with increased somnolence and confusion. Has a history of autoimmune hepatitis and is on Imuran. She was found to have elevated ammonia level of 84 on admission. Started on lactulose, this morning, she is now completely awake and alert, per spouse she is back to her baseline. Since encephalopathy seems to have resolved, will discharge home on lactulose and Xifaxan. I have asked the patient and spouse to titrate lactulose to have at least 2 bowel movements a day. Patient instructed to follow-up with the hepatologist at [facility].   Active Problems:   ? Lower bilateral extremity cellulitis: suspect she has more of an eczema, however not able to rule out cellulitis at this time, started on IV vancomycin, both her lower extremities have less erythema today compared to yesterday. Transitioned to doxycycline for a few more days on discharge.     History of autoimmune hepatitis with cirrhosis: Followed at [facility] transplant clinic. On Imuran and budesonide. Per family and patient, her abdomen is currently at baseline-she is obese. No evidence of obvious/significant ascites on exam. Reviewed notes in care everywhere, patient on 01/06/15 had a MRI of the abdomen at [facility] which showed only small volume ascites and choledocholithiasis. Given lack of any significant clinical findings including abdominal pain, doubt any further imaging is required at this time.    Chronic back pain: Continue oxycodone, resume long-acting narcotic in the next few days.     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 Coumadin per pharmacy while inpatient.  Disposition: Home today.  Antibiotics:  See below   Anti-infectives    Start     Dose/Rate Route Frequency Ordered Stop   02/12/15 1700  vancomycin (VANCOCIN) IVPB 1000 mg/200 mL premix     1,000 mg 200 mL/hr over 60 Minutes Intravenous Every 8 hours 02/12/15 1551     02/12/15 0500  cefTRIAXone (ROCEPHIN) 1 g in dextrose 5 % 50 mL IVPB - Premix  Status:  Discontinued     1 g 100 mL/hr over 30 Minutes Intravenous Every 24 hours 02/12/15 0410 02/12/15 1546      DVT Prophylaxis: On Coumadin with therapeutic INR  Code Status: Full code   Family Communication Husband at bedside  Procedures:  None  CONSULTS:  None  MEDICATIONS: Scheduled Meds: . azaTHIOprine  50 mg Oral Daily  . budesonide  9 mg Oral Daily  . furosemide  40 mg Oral Daily  . lactulose  20 g Oral TID  . pantoprazole (PROTONIX) IV  40 mg Intravenous Q12H  . potassium chloride  40 mEq Oral BID  . sodium chloride  3 mL Intravenous Q12H  . vancomycin  1,000 mg Intravenous Q8H  . [START ON 02/14/2015] warfarin  10 mg Oral Q M,W,F-1800  . warfarin  7.5 mg Oral Q T,Th,S,Su-1800  . Warfarin - Pharmacist Dosing Inpatient   Does not apply q1800   Continuous Infusions:  PRN Meds:.ondansetron **OR** ondansetron (ZOFRAN) IV, oxyCODONE    PHYSICAL EXAM: Vital signs in last 24 hours: Filed Vitals:   02/12/15 0937 02/12/15 2035 02/13/15 0555 02/13/15 0754  BP: 124/73  118/68 131/75 132/92  Pulse: 108 102 94 88  Temp: 98.5 F (36.9 C) 98.9 F (37.2 C) 98.6 F (37 C) 98 F (36.7 C)  TempSrc: Oral Oral Oral Oral  Resp: 18 17 16 17   Height:      Weight:  102.5 kg (225 lb 15.5 oz)    SpO2: 96% 93% 95% 97%    Weight change: 0.894 kg (1 lb 15.5 oz) Filed Weights   02/11/15 1909 02/12/15 0307 02/12/15 2035  Weight: 101.606 kg (224 lb) 102.6 kg (226 lb 3.1 oz) 102.5 kg (225 lb 15.5 oz)   Body mass index is 45.62  kg/(m^2).   Gen Exam: Awake and alert with clear speech.   Neck: Supple, No JVD.   Chest: B/L Clear.   CVS: S1 S2 Regular, no murmurs.  Abdomen: soft, BS +, non tender, obese, but not tense and no obvious evidence of ascites. Extremities: no edema, ? Patchy but very minimal cellulitic changes in bilateral calf area. Neurologic: Non Focal.   Skin: No Rash.   Wounds: N/A.    Intake/Output from previous day:  Intake/Output Summary (Last 24 hours) at 02/13/15 1041 Last data filed at 02/13/15 0848  Gross per 24 hour  Intake    120 ml  Output      0 ml  Net    120 ml     LAB RESULTS: CBC  Recent Labs Lab 02/11/15 1940 02/13/15 0715  WBC 5.9 3.3*  HGB 10.6* 15.0  HCT 31.4* 44.0  PLT 108* 81*  MCV 115.0* 114.9*  MCH 38.8* 39.2*  MCHC 33.8 34.1  RDW  --  31.5*  LYMPHSABS 0.7  --   MONOABS 0.2  --   EOSABS 0.1  --   BASOSABS 0.0  --     Chemistries   Recent Labs Lab 02/11/15 1940 02/12/15 0647 02/13/15 0715  NA 137 142 140  K 3.6 3.2* 3.5  CL 106 107 109  CO2 27 25 26   GLUCOSE 93 89 117*  BUN 27* 24* 23  CREATININE 0.68 0.74 0.78  CALCIUM 8.0* 7.7* 8.2*    CBG:  Recent Labs Lab 02/11/15 1925  GLUCAP 96    GFR Estimated Creatinine Clearance: 100.7 mL/min (by C-G formula based on Cr of 0.78).  Coagulation profile  Recent Labs Lab 02/11/15 1940 02/12/15 0647 02/13/15 0715  INR 2.42* 2.16* 2.07*    Cardiac Enzymes  Recent Labs Lab 02/11/15 1940  TROPONINI <0.03    Invalid input(s): POCBNP No results for input(s): DDIMER in the last 72 hours. No results for input(s): HGBA1C in the last 72 hours. No results for input(s): CHOL, HDL, LDLCALC, TRIG, CHOLHDL, LDLDIRECT in the last 72 hours.  Recent Labs  02/12/15 0647  TSH 4.690*   No results for input(s): VITAMINB12, FOLATE, FERRITIN, TIBC, IRON, RETICCTPCT in the last 72 hours. No results for input(s): LIPASE, AMYLASE in the last 72 hours.  Urine Studies No results for input(s):  UHGB, CRYS in the last 72 hours.  Invalid input(s): UACOL, UAPR, USPG, UPH, UTP, UGL, UKET, UBIL, UNIT, UROB, ULEU, UEPI, UWBC, URBC, UBAC, CAST, UCOM, BILUA  MICROBIOLOGY: No results found for this or any previous visit (from the past 240 hour(s)).  RADIOLOGY STUDIES/RESULTS: Dg Chest 2 View  02/11/2015   CLINICAL DATA:  Syncopal episode today.  Now feels out of it.  EXAM: CHEST  2 VIEW  COMPARISON:  CT chest 05/30/2009  FINDINGS: Shallow lung inflation. Heart size is accentuated by technique. The lungs  are clear. No pulmonary edema.  IMPRESSION: 1. Shallow inflation. 2.  No evidence for acute  abnormality.   Electronically Signed   By: Nolon Nations M.D.   On: 02/11/2015 21:04   Ct Head Wo Contrast  02/11/2015   CLINICAL DATA:  Syncopal episode earlier today. Altered mental state.  EXAM: CT HEAD WITHOUT CONTRAST  TECHNIQUE: Contiguous axial images were obtained from the base of the skull through the vertex without intravenous contrast.  COMPARISON:  Brain MRI on 01/30/2014  FINDINGS: No evidence of intracranial hemorrhage, brain edema, or other signs of acute infarction. No evidence of intracranial mass lesion or mass effect.  No abnormal extraaxial fluid collections identified. Ventricles are normal in size. No skull abnormality identified.  IMPRESSION: Negative noncontrast head CT.   Electronically Signed   By: Earle Gell M.D.   On: 02/11/2015 22:15    Oren Binet, MD  [facility] Pager:[phone]  If 7PM-7AM, please contact night-coverage [email or web address] Password [email or web address] 02/13/2015, 10:41 AM   LOS: 2 days

## 2015-02-13 NOTE — Progress Notes (Signed)
UR Completed.  [phone]  

## 2015-02-13 NOTE — Progress Notes (Signed)
ANTICOAGULATION CONSULT NOTE - Follow Up Consult  Pharmacy Consult for Coumadin Indication: Hx of DVT  Allergies  Allergen Reactions  . Influenza Vaccines     "Got sick x months"   Patient Measurements: Height: 4\' 11"  (149.9 cm) Weight: 225 lb 15.5 oz (102.5 kg) IBW/kg (Calculated) : 43.2  Vital Signs: Temp: 98 F (36.7 C) (02/14 0754) Temp Source: Oral (02/14 0754) BP: 132/92 mmHg (02/14 0754) Pulse Rate: 88 (02/14 0754)  Labs:  Recent Labs  02/11/15 1940 02/12/15 0647 02/13/15 0715  HGB 10.6*  --  15.0  HCT 31.4*  --  44.0  PLT 108*  --  81*  LABPROT 26.3* 24.3* 23.5*  INR 2.42* 2.16* 2.07*  CREATININE 0.68 0.74 0.78  TROPONINI <0.03  --   --     Estimated Creatinine Clearance: 100.7 mL/min (by C-G formula based on Cr of 0.78).   Medications:  Prescriptions prior to admission  Medication Sig Dispense Refill Last Dose  . azaTHIOprine (IMURAN) 50 MG tablet Take 50 mg by mouth daily. Takes 4 tablets daily.   02/11/2015 at Unknown time  . budesonide (ENTOCORT EC) 3 MG 24 hr capsule Take 3 mg by mouth daily. Takes 3 tabs q AM = 9 mg   02/11/2015 at Unknown time  . chlorzoxazone (PARAFON) 500 MG tablet Take by mouth 4 (four) times daily as needed for muscle spasms.   02/11/2015 at Unknown time  . furosemide (LASIX) 40 MG tablet Take 40 mg by mouth daily.   02/11/2015 at Unknown time  . Multiple Vitamins-Minerals (ZINC PO) Take 1 capsule by mouth daily.   02/11/2015 at Unknown time  . Oxycodone HCl 10 MG TABS Take 15 mg by mouth 5 (five) times daily as needed. Pt takes 1 1/2 tabs  As needed   02/11/2015 at Unknown time  . pantoprazole (PROTONIX) 40 MG tablet Take 40 mg by mouth daily.   02/11/2015 at Unknown time  . Skin Protectants, Misc. (EUCERIN) cream Apply topically as needed.   02/11/2015 at Unknown time  . Tapentadol HCl (NUCYNTA ER) 100 MG TB12 Take 100 mg by mouth 2 (two) times daily.   02/11/2015 at Unknown time  . triamcinolone cream (KENALOG) 0.1 % Apply topically 2  (two) times daily as needed.   02/11/2015 at Unknown time  . warfarin (COUMADIN) 5 MG tablet Take 1.5 to 2 tablets by mouth daily as directed by coumadin clinic (Patient taking differently: Take 7.5-10 mg by mouth one time only at 6 PM. Take 7.5mg  on MWF. Take 10mg  the rest of the week) 50 tablet 5 02/10/2015 at Unknown time    Assessment: 40 yo F presents with a syncopal event. Pharmacy dosing coumadin for hx of DVT. Current INR is therapeutic at 2.07. Hgb ok, plts 81. No s/s of bleed  PTA Coumadin is 7.5mg  daily exc 10mg  on M/W/F  Goal of Therapy:  INR 2-3 Monitor platelets by anticoagulation protocol: Yes   Plan:  Continue PTA Coumadin (7.5mg  today) Monitor daily INR, CBC, s/s of bleed  Donyelle Enyeart J 02/13/2015,10:14 AM

## 2015-02-14 ENCOUNTER — Telehealth: Payer: Self-pay | Admitting: Hematology & Oncology

## 2015-02-14 ENCOUNTER — Telehealth: Payer: Self-pay | Admitting: *Deleted

## 2015-02-14 ENCOUNTER — Other Ambulatory Visit: Payer: 59 | Admitting: Lab

## 2015-02-14 ENCOUNTER — Ambulatory Visit: Payer: 59 | Admitting: Family

## 2015-02-14 NOTE — Telephone Encounter (Signed)
Patient cancelled appointment today because she was seen in the ED over the weekend and doesn't feel like she still needs to see Dr Marin Olp. Discharge summary from the ED given to Dr Marin Olp to review. He does want to see patient prior to her current next scheduled appointment in June. Spoke with patient, and will send message to scheduler, to make appointment in the next week or two. Patient in agreement.

## 2015-02-14 NOTE — Telephone Encounter (Signed)
Pt aware of 2-26 appointment

## 2015-02-16 ENCOUNTER — Ambulatory Visit (INDEPENDENT_AMBULATORY_CARE_PROVIDER_SITE_OTHER): Payer: 59 | Admitting: Internal Medicine

## 2015-02-16 ENCOUNTER — Encounter: Payer: Self-pay | Admitting: Internal Medicine

## 2015-02-16 VITALS — BP 116/68 | HR 96 | Temp 98.0°F | Ht 59.0 in | Wt 241.4 lb

## 2015-02-16 DIAGNOSIS — I82409 Acute embolism and thrombosis of unspecified deep veins of unspecified lower extremity: Secondary | ICD-10-CM

## 2015-02-16 DIAGNOSIS — L03119 Cellulitis of unspecified part of limb: Secondary | ICD-10-CM

## 2015-02-16 DIAGNOSIS — K7290 Hepatic failure, unspecified without coma: Secondary | ICD-10-CM

## 2015-02-16 DIAGNOSIS — K7682 Hepatic encephalopathy: Secondary | ICD-10-CM

## 2015-02-16 LAB — POCT INR: INR: 2.2

## 2015-02-16 MED ORDER — NYSTATIN 100000 UNIT/ML MT SUSP: 5.0000 mL | Freq: Four times a day (QID) | OROMUCOSAL | Status: AC

## 2015-02-16 NOTE — Progress Notes (Signed)
Pre visit review using our clinic review tool, if applicable. No additional management support is needed unless otherwise documented below in the visit note. 

## 2015-02-16 NOTE — Progress Notes (Signed)
Subjective:    Patient ID: Margaret [name], female    DOB: 06-24-1975, 40 y.o.   MRN: [id number]  DOS:  02/16/2015 Type of visit - description :  Hospital follow-up   Admit Date: 02/11/2015 Discharge date: 02/13/2015  Recommendations for Outpatient Follow-up:  1. New medication: Xifaxan and lactulose for hepatic encephalopathy   BRIEF HOSPITAL COURSE:  Hepatic encephalopathy: Patient presented with increased somnolence and confusion. Has a history of autoimmune hepatitis and is on Imuran. She was found to have elevated ammonia level of 84 on admission. Started on lactulose, this morning, she is now completely awake and alert, per spouse she is back to her baseline. Since encephalopathy seems to have resolved, will discharge home on lactulose and Xifaxan. I have asked the patient and spouse to titrate lactulose to have at least 2 bowel movements a day. Patient instructed to follow-up with the hepatologist at [facility].    ? Lower bilateral extremity cellulitis: suspect she has more of an eczema, however not able to rule out cellulitis at this time, started on IV vancomycin, both her lower extremities have less erythema today compared to yesterday. Transitioned to doxycycline for a few more days on discharge.    History of autoimmune hepatitis with cirrhosis: Followed at [facility] transplant clinic. On Imuran and budesonide. Per family and patient, her abdomen is currently at baseline-she is obese. No evidence of obvious/significant ascites on exam. Reviewed notes in care everywhere, patient on 01/06/15 had a MRI of the abdomen at [facility] which showed only small volume ascites and choledocholithiasis. Given lack of any significant clinical findings including abdominal pain, doubt any further imaging is required at this time.   Chronic back pain: Continue oxycodone, resume long-acting narcotic in the next few days.   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     Review of Systems Here with her husband Since she left the hospital, mentation is improved, still slightly foggy, according to the husband she is 80% better. Had leg cellulitis, on doxycycline, leg edema has decrease, redness looks better. Hepatic disease, decreased lactulose dose and is still having more than 4 BMs daily. She was unable to get Xifaxan up to yesterday.  Complaining thrush in the mouth. Needs a medication  Denies fever chills No nausea, vomiting, blood in the stools. Had abdominal pain while in the hospital,  better now. Abdomen is swollen, patient quite concerned about it. Lower extremity edema decrease.  Past Medical History  Diagnosis Date  . DVT (deep venous thrombosis) 1996, 2000    h/o  . GERD (gastroesophageal reflux disease)   . Obesity, morbid   . Anticardiolipin antibody syndrome   . Hepatitis, autoimmune   . Abnormal menses   . Sleep apnea      mild to moderate  per apnea link y 3-12  . Chronic headaches   . Chronic lower back pain     Dr. Vira Blanco    Past Surgical History  Procedure Laterality Date  . Dilation and curettage of uterus  11/2008    History   Social History  . Marital Status: Married    Spouse Name: N/A  . Number of Children: 1  . 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 Smoking status: Former Smoker -- 0.25 packs/day for 16 years    Types: Cigarettes    Start date: 12/31/1996    Quit  date: 06/29/2012  . Smokeless tobacco: Never Used     Comment: QUIT SMOKING 1.5 YEARS AGO  . Alcohol Use: Yes     Comment: occasionally-3 drinks per weekend  . Drug Use: No  . Sexual Activity: Not on file   Other Topics Concern  . Not on file   Social History Narrative        Medication List       This list is accurate as of: 02/16/15  5:51 PM.  Always use your most recent med list.               azaTHIOprine 50 MG tablet  Commonly  known as:  IMURAN  Take 50 mg by mouth daily. Takes 4 tablets daily.     budesonide 3 MG 24 hr capsule  Commonly known as:  ENTOCORT EC  Take 3 mg by mouth daily. Takes 3 tabs q AM = 9 mg     chlorzoxazone 500 MG tablet  Commonly known as:  PARAFON  Take by mouth 4 (four) times daily as needed for muscle spasms.     doxycycline 50 MG capsule  Commonly known as:  VIBRAMYCIN  Take 2 capsules (100 mg total) by mouth 2 (two) times daily.     eucerin cream  Apply topically as needed.     lactulose 10 GM/15ML solution  Commonly known as:  CHRONULAC  Take 30 mLs (20 g total) by mouth 2 (two) times daily. Titrate to have around 2 bowel movements a day.     LASIX 40 MG tablet  Generic drug:  furosemide  Take 40 mg by mouth daily.     NUCYNTA ER 100 MG Tb12  Generic drug:  Tapentadol HCl  Take 100 mg by mouth 2 (two) times daily.     nystatin 100000 UNIT/ML suspension  Commonly known as:  MYCOSTATIN  Take 5 mLs (500,000 Units total) by mouth 4 (four) times daily. Swish and swallow     Oxycodone HCl 10 MG Tabs  Take 15 mg by mouth 5 (five) times daily as needed. Pt takes 1 1/2 tabs  As needed     pantoprazole 40 MG tablet  Commonly known as:  PROTONIX  Take 40 mg by mouth daily.     potassium chloride 10 MEQ tablet  Commonly known as:  K-DUR  Take 10 mEq by mouth daily. Take 1/2-1 tablet every night at bedtime as needed     rifaximin 550 MG Tabs tablet  Commonly known as:  XIFAXAN  Take 1 tablet (550 mg total) by mouth 2 (two) times daily.     triamcinolone cream 0.1 %  Commonly known as:  KENALOG  Apply topically 2 (two) times daily as needed.     warfarin 5 MG tablet  Commonly known as:  COUMADIN  Take 1.5 to 2 tablets by mouth daily as directed by coumadin clinic     Zinc 50 MG Caps  Take 1 capsule by mouth 2 (two) times daily.     ZINC PO  Take 1 capsule by mouth daily.           Objective:   Physical Exam BP 116/68 mmHg  Pulse 96  Temp(Src) 98 F (36.7  C) (Oral)  Ht 4\' 11"  (1.499 m)  Wt 241 lb 6 oz (109.487 kg)  BMI 48.73 kg/m2  SpO2 93%  General:   Well developed, well nourished . NAD.  HEENT:  Normocephalic . Face symmetric, atraumatic; mild jaundice Throat w/ Dimichele  lesions c/w thrush Lungs:  CTA B Normal respiratory effort, no intercostal retractions, no accessory muscle use. Heart: RRR,  no murmur.  Abdomen:  Slightly tender at the upper abdomen without mass or rebound. Lower half abdominal wall with mild itching edema.  Muscle skeletal: LE skin is mottled and has a   violaceous color. Right calf: Patch of cellulitis improve according to the husband, there is a residual area of mild redness and warmness Neurologic:  alert & oriented X3.  Speech normal, gait appropriate for age and unassisted Psych--  Cognition and judgment appear intact.  Cooperative with normal attention span and concentration.  Behavior appropriate. No anxious or depressed appearing.       Assessment & Plan:     Thrush,  Prescribe  nystatin  Coumadin taking 2 tablets daily, 1.5 tablet Monday Wednesday and Friday. INR today 2.2, message sent to cardiology

## 2015-02-16 NOTE — Patient Instructions (Signed)
Use nystatin for 5 days  Take doxycycline until gone  Continue with his same Coumadin until you hear from the Coumadin clinic  Decrease lactulose to 10 mL daily, decrease to 5 mL daily if you still have more than 2 or 3 bowel movements daily

## 2015-02-16 NOTE — Assessment & Plan Note (Signed)
Just started xifaxan yesterday. On lactulose, dose decreased from 30 mL twice a day to 15 mL daily, still have 4-5 bowel movements daily. Recommend to decrease lactulose to 10 mL daily, then to 5 mL if needed Complain of the size of her abdomen, on exam there  is some abdominal wall swelling. This is not new problem, in fact that is on and off issue. Recommend to discuss further with her liver specialist. She has an appointment to 02-21-2015.

## 2015-02-16 NOTE — Assessment & Plan Note (Signed)
Improving, continue with doxycycline until gone

## 2015-02-18 ENCOUNTER — Telehealth: Payer: Self-pay | Admitting: Pharmacist

## 2015-02-18 ENCOUNTER — Ambulatory Visit (INDEPENDENT_AMBULATORY_CARE_PROVIDER_SITE_OTHER): Payer: 59 | Admitting: Cardiology

## 2015-02-18 DIAGNOSIS — I82409 Acute embolism and thrombosis of unspecified deep veins of unspecified lower extremity: Secondary | ICD-10-CM

## 2015-02-18 DIAGNOSIS — Z5181 Encounter for therapeutic drug level monitoring: Secondary | ICD-10-CM

## 2015-02-18 NOTE — Telephone Encounter (Signed)
Telephoned pt and she wanted to know if we got INR from Dr Larose Kells. See anticoagulation note,.

## 2015-02-18 NOTE — Telephone Encounter (Signed)
New message     Calling to get pt/inr results faxed over to [place] from Dr Larose Kells?  Will pt need to keep next coumadin clinic appt?

## 2015-02-21 LAB — BASIC METABOLIC PANEL
Creatinine: 0.7 mg/dL (ref <=1.1)
Potassium: 4 mmol/L (ref 3.4–5.3)
Sodium: 138 mmol/L (ref 137–147)

## 2015-02-21 LAB — HEPATIC FUNCTION PANEL
ALT: 36 U/L — AB (ref 7–35)
AST: 32 U/L (ref 13–35)
Alkaline Phosphatase: 87 U/L (ref 25–125)
BILIRUBIN, TOTAL: 2 mg/dL

## 2015-02-21 LAB — CBC AND DIFFERENTIAL
HCT: 34 % — AB (ref 36–46)
Hemoglobin: 11.8 g/dL — AB (ref 12.0–16.0)
WBC: 6.7 10*3/mL

## 2015-02-21 LAB — POCT INR: INR: 2.3 — AB (ref <=1.1)

## 2015-02-23 ENCOUNTER — Telehealth: Payer: Self-pay | Admitting: Internal Medicine

## 2015-02-23 MED ORDER — DOXYCYCLINE HYCLATE 50 MG PO CAPS: 100.0000 mg | ORAL_CAPSULE | Freq: Two times a day (BID) | ORAL | Status: DC

## 2015-02-23 NOTE — Telephone Encounter (Signed)
Please advise 

## 2015-02-23 NOTE — Telephone Encounter (Signed)
She is taking doxycycline for 10 days, okay to call 8 additional tablets to complete a 2-week  treatment. If not will at the end of the treatment needs to be seen

## 2015-02-23 NOTE — Telephone Encounter (Signed)
Caller name: Shalona Relation to pt: self Call back number: [phone] Pharmacy: [facility] at [address]  Reason for call:   Patient states that she still has the rash from last week visit and is out of the antibiotic and is wanting to know if Dr. Larose Kells would prescribe more.

## 2015-02-23 NOTE — Telephone Encounter (Signed)
8 additional tablets sent to [facility].

## 2015-02-24 ENCOUNTER — Telehealth: Payer: Self-pay | Admitting: Internal Medicine

## 2015-02-24 NOTE — Telephone Encounter (Signed)
Caller name: [name], [name] Relation to OO:JZBF  Call back number: [phone] Pharmacy: [id number] - [place], [place] - [address] [phone] (Phone) [phone] (Fax)         Reason for call:  Pt states there was only 2 pills of doxycycline (VIBRAMYCIN) 50 MG capsule for her skin rash that keeps popping up on both legs 2 huge spots near thighs and smaller spots underneath her calf. Pt said she will need in more medication in need of clinical advice.

## 2015-02-24 NOTE — Telephone Encounter (Signed)
Per Dr. Larose Kells, [place] called yesterday needing more antibiotics, only gave me authorization for 8 additional tablets that were sent to her pharmacy. Per Dr. Larose Kells if still not noticing improvement she will need to be seen again. (See phone note from 02/23/2015).

## 2015-02-24 NOTE — Telephone Encounter (Signed)
Pt advised voiced understanding and will keep MD posted

## 2015-02-25 ENCOUNTER — Encounter: Payer: Self-pay | Admitting: Family

## 2015-02-25 ENCOUNTER — Other Ambulatory Visit (HOSPITAL_BASED_OUTPATIENT_CLINIC_OR_DEPARTMENT_OTHER): Payer: 59 | Admitting: Lab

## 2015-02-25 ENCOUNTER — Ambulatory Visit (HOSPITAL_BASED_OUTPATIENT_CLINIC_OR_DEPARTMENT_OTHER): Payer: 59 | Admitting: Family

## 2015-02-25 ENCOUNTER — Ambulatory Visit (INDEPENDENT_AMBULATORY_CARE_PROVIDER_SITE_OTHER): Payer: 59 | Admitting: *Deleted

## 2015-02-25 VITALS — BP 127/82 | HR 110 | Temp 98.2°F | Resp 16 | Ht <= 58 in | Wt 250.0 lb

## 2015-02-25 DIAGNOSIS — I82409 Acute embolism and thrombosis of unspecified deep veins of unspecified lower extremity: Secondary | ICD-10-CM

## 2015-02-25 DIAGNOSIS — D72829 Elevated white blood cell count, unspecified: Secondary | ICD-10-CM

## 2015-02-25 DIAGNOSIS — D6861 Antiphospholipid syndrome: Secondary | ICD-10-CM

## 2015-02-25 DIAGNOSIS — K754 Autoimmune hepatitis: Secondary | ICD-10-CM

## 2015-02-25 DIAGNOSIS — R161 Splenomegaly, not elsewhere classified: Secondary | ICD-10-CM

## 2015-02-25 DIAGNOSIS — D6959 Other secondary thrombocytopenia: Secondary | ICD-10-CM

## 2015-02-25 DIAGNOSIS — Z5181 Encounter for therapeutic drug level monitoring: Secondary | ICD-10-CM

## 2015-02-25 DIAGNOSIS — Z7901 Long term (current) use of anticoagulants: Secondary | ICD-10-CM

## 2015-02-25 LAB — CMP (CANCER CENTER ONLY)
ALT(SGPT): 33 U/L (ref 10–47)
AST: 36 U/L (ref 11–38)
Albumin: 2.5 g/dL — ABNORMAL LOW (ref 3.3–5.5)
Alkaline Phosphatase: 82 U/L (ref 26–84)
BUN: 20 mg/dL (ref 7–22)
CALCIUM: 8.4 mg/dL (ref 8.0–10.3)
CHLORIDE: 101 meq/L (ref 98–108)
CO2: 26 meq/L (ref 18–33)
CREATININE: 0.6 mg/dL (ref 0.6–1.2)
GLUCOSE: 138 mg/dL — AB (ref 73–118)
Potassium: 4.2 mEq/L (ref 3.3–4.7)
Sodium: 137 mEq/L (ref 128–145)
Total Bilirubin: 1.8 mg/dl — ABNORMAL HIGH (ref 0.20–1.60)
Total Protein: 6.1 g/dL — ABNORMAL LOW (ref 6.4–8.1)

## 2015-02-25 LAB — CBC WITH DIFFERENTIAL (CANCER CENTER ONLY)
BASO#: 0 10*3/uL (ref 0.0–0.2)
BASO%: 0.1 % (ref 0.0–2.0)
EOS ABS: 0.1 10*3/uL (ref 0.0–0.5)
EOS%: 1.1 % (ref 0.0–7.0)
HCT: 34.2 % — ABNORMAL LOW (ref 34.8–46.6)
HGB: 11.5 g/dL — ABNORMAL LOW (ref 11.6–15.9)
LYMPH#: 0.5 10*3/uL — ABNORMAL LOW (ref 0.9–3.3)
LYMPH%: 7.3 % — ABNORMAL LOW (ref 14.0–48.0)
MCH: 40.2 pg — ABNORMAL HIGH (ref 26.0–34.0)
MCHC: 33.6 g/dL (ref 32.0–36.0)
MCV: 120 fL — ABNORMAL HIGH (ref 81–101)
MONO#: 0.3 10*3/uL (ref 0.1–0.9)
MONO%: 4.3 % (ref 0.0–13.0)
NEUT#: 6.2 10*3/uL (ref 1.5–6.5)
NEUT%: 87.2 % — AB (ref 39.6–80.0)
Platelets: 71 10*3/uL — ABNORMAL LOW (ref 145–400)
RBC: 2.86 10*6/uL — ABNORMAL LOW (ref 3.70–5.32)
RDW: 24.4 % — ABNORMAL HIGH (ref 11.1–15.7)
WBC: 7.2 10*3/uL (ref 3.9–10.0)

## 2015-02-25 LAB — POCT INR: INR: 2.8

## 2015-02-25 LAB — CHCC SATELLITE - SMEAR

## 2015-02-25 NOTE — Progress Notes (Signed)
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 She was hospitalized last week with confusion and an elevated ammonia level. She was given fluids and lactulose which brought her ammonia level back down. She is still feeling weak and tired.  Her hernia is still a source of discomfort for her. She states that she has been told she is considered too high a risk at this time for surgery. She is followed by the pain clinic.  She is doing well on Coumadin. No episodes of bleeding. She is followed by the coumadin clinic and has an appointment today to have her INR checked.  She has no appetite but has been drinking lots of fluids. She appears to be retaining more fluid today. She is on spironolactone to try and remove it.  She denies fever, chills, cough, rash, dizziness, chest pain, palpitations, constipation, diarrhea, blood in urine or stool.  She has had no numbness or tingling in her extremities. No new aches or pains.   CURRENT TREATMENT: Lifelong Coumadin  REVIEW OF SYSTEMS: All other 10 point review of systems is negative except for those issues mentioned above.  PAST MEDICAL HISTORY: Past Medical History  Diagnosis Date  . DVT (deep venous thrombosis) 1996, 2000    h/o  . GERD (gastroesophageal reflux disease)   . Obesity, morbid   . Anticardiolipin antibody syndrome   . Hepatitis, autoimmune   . Abnormal menses   . Sleep apnea      mild to moderate  per apnea link y 3-12  . Chronic headaches   . Chronic  lower back pain     Dr. Vira Blanco   PAST SURGICAL HISTORY: Past Surgical History  Procedure Laterality Date  . Dilation and curettage of uterus  11/2008   FAMILY HISTORY Family History  Problem Relation Age of Onset  . Adopted: Yes   GYNECOLOGIC HISTORY:  No LMP recorded. Patient is not currently having periods (Reason: Irregular Periods).   SOCIAL HISTORY:  History   Social History  . Marital Status: Married    Spouse Name: N/A  . Number of Children: 1  . 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 Smoking status: Former Smoker -- 0.25 packs/day for 16 years    Types: Cigarettes    Start date: 12/31/1996    Quit date: 06/29/2012  . Smokeless tobacco: Never Used     Comment: QUIT SMOKING 1.5 YEARS AGO  . Alcohol Use: Yes     Comment: occasionally-3 drinks per weekend  . Drug Use: No  . Sexual Activity: Not on file   Other Topics Concern  . Not on file   Social History Narrative   ADVANCED DIRECTIVES: <no information>  HEALTH MAINTENANCE: History  Substance Use Topics  . Smoking status: Former Smoker -- 0.25 packs/day for 16 years    Types: Cigarettes    Start date: 12/31/1996    Quit date: 06/29/2012  . Smokeless tobacco: Never Used     Comment: QUIT SMOKING 1.5 YEARS AGO  .  Alcohol Use: Yes     Comment: occasionally-3 drinks per weekend   Colonoscopy: PAP: Bone density: Lipid panel:  Allergies  Allergen Reactions  . Influenza Vaccines     "Got sick x months"   Current Outpatient Prescriptions  Medication Sig Dispense Refill  . azaTHIOprine (IMURAN) 50 MG tablet Take 50 mg by mouth daily. Takes 4 tablets daily.    . budesonide (ENTOCORT EC) 3 MG 24 hr capsule Take 3 mg by mouth daily. Takes 3 tabs q AM = 9 mg    . chlorzoxazone (PARAFON) 500 MG tablet Take by mouth 4 (four) times daily as needed for muscle spasms.    Marland Kitchen doxycycline (VIBRAMYCIN) 50 MG capsule Take 2 capsules (100 mg total) by  mouth 2 (two) times daily. 8 capsule 0  . furosemide (LASIX) 40 MG tablet Take 40 mg by mouth daily.    Marland Kitchen lactulose (CHRONULAC) 10 GM/15ML solution Take 30 mLs (20 g total) by mouth 2 (two) times daily. Titrate to have around 2 bowel movements a day. 240 mL 0  . Multiple Vitamins-Minerals (ZINC PO) Take 1 capsule by mouth daily.    Marland Kitchen nystatin (MYCOSTATIN) 100000 UNIT/ML suspension Take 5 mLs (500,000 Units total) by mouth 4 (four) times daily. Swish and swallow 120 mL 0  . Oxycodone HCl 10 MG TABS Take 15 mg by mouth 5 (five) times daily as needed. Pt takes 1 1/2 tabs  As needed    . pantoprazole (PROTONIX) 40 MG tablet Take 40 mg by mouth daily.    . potassium chloride (K-DUR) 10 MEQ tablet Take 10 mEq by mouth daily. Take 1/2-1 tablet every night at bedtime as needed    . rifaximin (XIFAXAN) 550 MG TABS tablet Take 1 tablet (550 mg total) by mouth 2 (two) times daily. 60 tablet 0  . Skin Protectants, Misc. (EUCERIN) cream Apply topically as needed.    . Tapentadol HCl (NUCYNTA ER) 100 MG TB12 Take 100 mg by mouth 2 (two) times daily.    Marland Kitchen triamcinolone cream (KENALOG) 0.1 % Apply topically 2 (two) times daily as needed.    . warfarin (COUMADIN) 5 MG tablet Take 1.5 to 2 tablets by mouth daily as directed by coumadin clinic (Patient taking differently: Take 7.5-10 mg by mouth one time only at 6 PM. Take 7.5mg  on MWF. Take 10mg  the rest of the week) 50 tablet 5  . Zinc 50 MG CAPS Take 1 capsule by mouth 2 (two) times daily.     No current facility-administered medications for this visit.   OBJECTIVE: There were no vitals filed for this visit. There is no weight on file to calculate BMI. ECOG FS:1 - Symptomatic but completely ambulatory Ocular: Sclerae unicteric, pupils equal, round and reactive to light Ear-nose-throat: Oropharynx clear, dentition fair Lymphatic: No cervical or supraclavicular adenopathy Lungs no rales or rhonchi, good excursion bilaterally Heart regular rate and rhythm, no  murmur appreciated Abd soft, nontender, positive bowel sounds MSK no focal spinal tenderness, no joint edema Neuro: non-focal, well-oriented, appropriate affect Breasts: Deferred  LAB RESULTS: CMP     Component Value Date/Time   NA 140 02/13/2015 0715   NA 135 12/15/2014 1412   K 3.5 02/13/2015 0715   K 3.6 12/15/2014 1412   CL 109 02/13/2015 0715   CL 106 12/15/2014 1412   CO2 26 02/13/2015 0715   CO2 26 12/15/2014 1412   GLUCOSE 117* 02/13/2015 0715   GLUCOSE 109 12/15/2014 1412   BUN 23 02/13/2015 0715  BUN 12 12/15/2014 1412   CREATININE 0.78 02/13/2015 0715   CREATININE 0.6 12/15/2014 1412   CALCIUM 8.2* 02/13/2015 0715   CALCIUM 7.3* 12/15/2014 1412   PROT 6.3 02/13/2015 0715   PROT 7.8 12/15/2014 1412   ALBUMIN 2.0* 02/13/2015 0715   AST 34 02/13/2015 0715   AST 122* 12/15/2014 1412   ALT 30 02/13/2015 0715   ALT 99* 12/15/2014 1412   ALKPHOS 75 02/13/2015 0715   ALKPHOS 82 12/15/2014 1412   BILITOT 2.3* 02/13/2015 0715   BILITOT 1.20 12/15/2014 1412   GFRNONAA >90 02/13/2015 0715   GFRAA >90 02/13/2015 0715   No results found for: SPEP Lab Results  Component Value Date   WBC 3.3* 02/13/2015   NEUTROABS 4.9 02/11/2015   HGB 15.0 02/13/2015   HCT 44.0 02/13/2015   MCV 114.9* 02/13/2015   PLT 81* 02/13/2015   No results found for: LABCA2 No components found for: EFEOF121 No results for input(s): INR in the last 168 hours. STUDIES: No results found.  ASSESSMENT/PLAN: Margaret [name] is a 40 year old female with leukopenia thrombocytopenia secondary to her cirrhosis and splenomegaly. She is still recuperating from her hospital stay last week. She is starting to feel better but is still weak. Her ammonia level has come down and she is on spironolactone to diurese the extra fluid.  She has not had a blood clot in over 6 years. She is on Coumadin and we will continue with this same therapy. She has had no episodes of bleeding. She has weekly checks at the Coumadin  clinic.  Her Hgb today was 11.5 MCV 120. Dr. Marin Olp viewed her blood smear and did not observe anything indicative of malignancy.  We will see her back in 2 months for labs and follow-up.  She is in agreement with this and knows to call here with any questions or concerns. We can certainly see her sooner if need be.   Eliezer Bottom, NP 02/25/2015 9:38 AM

## 2015-02-28 ENCOUNTER — Telehealth: Payer: Self-pay

## 2015-02-28 NOTE — Telephone Encounter (Signed)
Noted  

## 2015-02-28 NOTE — Telephone Encounter (Signed)
Agree, we can't refill the medication. (She discontinue Wellbutrin and Lexapro ~ November 2014, they were actually not helping according to the patient; also she now has liver disease )

## 2015-02-28 NOTE — Telephone Encounter (Signed)
Pt is requesting refill on Escitalopram 10 mg. Do not see on Pts current med list. Please advise.

## 2015-03-04 ENCOUNTER — Encounter: Payer: Self-pay | Admitting: Internal Medicine

## 2015-03-04 ENCOUNTER — Ambulatory Visit (INDEPENDENT_AMBULATORY_CARE_PROVIDER_SITE_OTHER): Payer: 59 | Admitting: *Deleted

## 2015-03-04 ENCOUNTER — Telehealth: Payer: Self-pay | Admitting: Internal Medicine

## 2015-03-04 DIAGNOSIS — R21 Rash and other nonspecific skin eruption: Secondary | ICD-10-CM

## 2015-03-04 DIAGNOSIS — I82409 Acute embolism and thrombosis of unspecified deep veins of unspecified lower extremity: Secondary | ICD-10-CM

## 2015-03-04 DIAGNOSIS — Z5181 Encounter for therapeutic drug level monitoring: Secondary | ICD-10-CM

## 2015-03-04 DIAGNOSIS — Z7901 Long term (current) use of anticoagulants: Secondary | ICD-10-CM

## 2015-03-04 LAB — POCT INR: INR: 4.7

## 2015-03-04 NOTE — Telephone Encounter (Signed)
If she has fever, the rash spreads, I'll have to see her before she sees Dr. Allyson Sabal; if the rash looks about the same proceed with referral, please try to get her in within the next couple of weeks

## 2015-03-04 NOTE — Telephone Encounter (Signed)
Caller name: [name] Relation to pt: husband Call back number: [phone] Pharmacy: [facility] on [address]  Reason for call:   [name] calling in for patient. He states that patient has finished the antiobiotics and rash has not gone away.

## 2015-03-04 NOTE — Telephone Encounter (Signed)
I'm not sure if more antibiotics are indicated. Please schedule a visit or arrange a dermatology referral

## 2015-03-04 NOTE — Telephone Encounter (Signed)
Spoke with Pt's husband [name], informed him of recommendation to see dermatology. [name] informed Pt had previously seen Dr. Allyson Sabal and would like to be referred to him again. Informed him that I would place referral to they will hear from someone to make an appt. [name] wanted to know if Pt could receive something for rash until she can be seen by dermatology. I informed him I would discuss with Dr. Larose Kells and let him know.

## 2015-03-04 NOTE — Telephone Encounter (Signed)
Please advise 

## 2015-03-05 ENCOUNTER — Emergency Department (HOSPITAL_COMMUNITY): Payer: 59

## 2015-03-05 ENCOUNTER — Inpatient Hospital Stay (HOSPITAL_COMMUNITY)
Admission: EM | Admit: 2015-03-05 | Discharge: 2015-04-01 | DRG: 871 | Disposition: E | Payer: 59 | Attending: Pulmonary Disease | Admitting: Pulmonary Disease

## 2015-03-05 ENCOUNTER — Encounter (HOSPITAL_COMMUNITY): Payer: Self-pay

## 2015-03-05 ENCOUNTER — Inpatient Hospital Stay (HOSPITAL_COMMUNITY): Payer: 59

## 2015-03-05 DIAGNOSIS — J9601 Acute respiratory failure with hypoxia: Secondary | ICD-10-CM

## 2015-03-05 DIAGNOSIS — N179 Acute kidney failure, unspecified: Secondary | ICD-10-CM | POA: Diagnosis present

## 2015-03-05 DIAGNOSIS — G9340 Encephalopathy, unspecified: Secondary | ICD-10-CM

## 2015-03-05 DIAGNOSIS — M545 Low back pain: Secondary | ICD-10-CM | POA: Diagnosis present

## 2015-03-05 DIAGNOSIS — R6521 Severe sepsis with septic shock: Secondary | ICD-10-CM | POA: Diagnosis present

## 2015-03-05 DIAGNOSIS — R791 Abnormal coagulation profile: Secondary | ICD-10-CM | POA: Diagnosis present

## 2015-03-05 DIAGNOSIS — E162 Hypoglycemia, unspecified: Secondary | ICD-10-CM | POA: Diagnosis present

## 2015-03-05 DIAGNOSIS — Z86718 Personal history of other venous thrombosis and embolism: Secondary | ICD-10-CM

## 2015-03-05 DIAGNOSIS — A419 Sepsis, unspecified organism: Secondary | ICD-10-CM

## 2015-03-05 DIAGNOSIS — G4733 Obstructive sleep apnea (adult) (pediatric): Secondary | ICD-10-CM | POA: Diagnosis present

## 2015-03-05 DIAGNOSIS — Z7901 Long term (current) use of anticoagulants: Secondary | ICD-10-CM | POA: Diagnosis not present

## 2015-03-05 DIAGNOSIS — K219 Gastro-esophageal reflux disease without esophagitis: Secondary | ICD-10-CM | POA: Diagnosis present

## 2015-03-05 DIAGNOSIS — E662 Morbid (severe) obesity with alveolar hypoventilation: Secondary | ICD-10-CM | POA: Diagnosis present

## 2015-03-05 DIAGNOSIS — K754 Autoimmune hepatitis: Secondary | ICD-10-CM | POA: Diagnosis present

## 2015-03-05 DIAGNOSIS — I469 Cardiac arrest, cause unspecified: Secondary | ICD-10-CM | POA: Diagnosis present

## 2015-03-05 DIAGNOSIS — G8929 Other chronic pain: Secondary | ICD-10-CM | POA: Diagnosis present

## 2015-03-05 DIAGNOSIS — D72819 Decreased white blood cell count, unspecified: Secondary | ICD-10-CM | POA: Diagnosis present

## 2015-03-05 DIAGNOSIS — D696 Thrombocytopenia, unspecified: Secondary | ICD-10-CM | POA: Diagnosis present

## 2015-03-05 DIAGNOSIS — J690 Pneumonitis due to inhalation of food and vomit: Secondary | ICD-10-CM | POA: Diagnosis present

## 2015-03-05 DIAGNOSIS — K7290 Hepatic failure, unspecified without coma: Secondary | ICD-10-CM | POA: Diagnosis present

## 2015-03-05 DIAGNOSIS — D6861 Antiphospholipid syndrome: Secondary | ICD-10-CM | POA: Diagnosis present

## 2015-03-05 DIAGNOSIS — Z6841 Body Mass Index (BMI) 40.0 and over, adult: Secondary | ICD-10-CM | POA: Diagnosis not present

## 2015-03-05 DIAGNOSIS — Z66 Do not resuscitate: Secondary | ICD-10-CM | POA: Diagnosis not present

## 2015-03-05 DIAGNOSIS — Z79899 Other long term (current) drug therapy: Secondary | ICD-10-CM | POA: Diagnosis not present

## 2015-03-05 DIAGNOSIS — A4152 Sepsis due to Pseudomonas: Principal | ICD-10-CM | POA: Diagnosis present

## 2015-03-05 DIAGNOSIS — R230 Cyanosis: Secondary | ICD-10-CM | POA: Diagnosis present

## 2015-03-05 DIAGNOSIS — E872 Acidosis: Secondary | ICD-10-CM | POA: Diagnosis present

## 2015-03-05 DIAGNOSIS — R4182 Altered mental status, unspecified: Secondary | ICD-10-CM

## 2015-03-05 DIAGNOSIS — Z887 Allergy status to serum and vaccine status: Secondary | ICD-10-CM

## 2015-03-05 DIAGNOSIS — Z87891 Personal history of nicotine dependence: Secondary | ICD-10-CM

## 2015-03-05 LAB — AMMONIA: AMMONIA: 54 umol/L — AB (ref 11–32)

## 2015-03-05 LAB — I-STAT TROPONIN, ED
TROPONIN I, POC: 0.03 ng/mL (ref 0.00–0.08)
TROPONIN I, POC: 0.06 ng/mL (ref 0.00–0.08)

## 2015-03-05 LAB — DIFFERENTIAL
BAND NEUTROPHILS: 0 % (ref 0–10)
BLASTS: 0 %
Basophils Absolute: 0 10*3/uL (ref 0.0–0.1)
Basophils Relative: 0 % (ref 0–1)
Eosinophils Absolute: 0.1 10*3/uL (ref 0.0–0.7)
Eosinophils Relative: 9 % — ABNORMAL HIGH (ref 0–5)
Lymphocytes Relative: 12 % (ref 12–46)
Lymphs Abs: 0.2 10*3/uL — ABNORMAL LOW (ref 0.7–4.0)
MYELOCYTES: 0 %
Metamyelocytes Relative: 0 %
Monocytes Absolute: 0 10*3/uL — ABNORMAL LOW (ref 0.1–1.0)
Monocytes Relative: 0 % — ABNORMAL LOW (ref 3–12)
NEUTROS PCT: 79 % — AB (ref 43–77)
Neutro Abs: 1.2 10*3/uL — ABNORMAL LOW (ref 1.7–7.7)
PROMYELOCYTES ABS: 0 %
WBC MORPHOLOGY: INCREASED
nRBC: 79 /100 WBC — ABNORMAL HIGH

## 2015-03-05 LAB — PREGNANCY, URINE: Preg Test, Ur: NEGATIVE

## 2015-03-05 LAB — I-STAT CHEM 8, ED
BUN: 46 mg/dL — ABNORMAL HIGH (ref 6–23)
BUN: 61 mg/dL — ABNORMAL HIGH (ref 6–23)
CALCIUM ION: 1.07 mmol/L — AB (ref 1.12–1.23)
CALCIUM ION: 0.96 mmol/L — AB (ref 1.12–1.23)
Chloride: 101 mmol/L (ref 96–112)
Chloride: 102 mmol/L (ref 96–112)
Creatinine, Ser: 2.8 mg/dL — ABNORMAL HIGH (ref 0.50–1.10)
Creatinine, Ser: 2.6 mg/dL — ABNORMAL HIGH (ref 0.50–1.10)
Glucose, Bld: 33 mg/dL — CL (ref 70–99)
Glucose, Bld: 152 mg/dL — ABNORMAL HIGH (ref 70–99)
HCT: 30 % — ABNORMAL LOW (ref 36.0–46.0)
HCT: 26 % — ABNORMAL LOW (ref 36.0–46.0)
Hemoglobin: 10.2 g/dL — ABNORMAL LOW (ref 12.0–15.0)
Hemoglobin: 8.8 g/dL — ABNORMAL LOW (ref 12.0–15.0)
Potassium: 5.1 mmol/L (ref 3.5–5.1)
Potassium: 6.5 mmol/L (ref 3.5–5.1)
SODIUM: 133 mmol/L — AB (ref 135–145)
Sodium: 130 mmol/L — ABNORMAL LOW (ref 135–145)
TCO2: 13 mmol/L (ref 0–100)
TCO2: 9 mmol/L (ref 0–100)

## 2015-03-05 LAB — ETHANOL: Alcohol, Ethyl (B): <5 mg/dL (ref 0–9)

## 2015-03-05 LAB — I-STAT ARTERIAL BLOOD GAS, ED
Acid-base deficit: 25 mmol/L — ABNORMAL HIGH (ref 0.0–2.0)
Bicarbonate: 5.2 mEq/L — ABNORMAL LOW (ref 20.0–24.0)
O2 Saturation: 97 %
PH ART: 6.973 — AB (ref 7.350–7.450)
PO2 ART: 134 mmHg — AB (ref 80.0–100.0)
TCO2: 6 mmol/L (ref 0–100)
pCO2 arterial: 22.3 mmHg — ABNORMAL LOW (ref 35.0–45.0)

## 2015-03-05 LAB — COMPREHENSIVE METABOLIC PANEL
ALT: 32 U/L (ref 0–35)
ANION GAP: 14 (ref 5–15)
AST: 100 U/L — AB (ref 0–37)
Albumin: 1.2 g/dL — ABNORMAL LOW (ref 3.5–5.2)
Alkaline Phosphatase: 53 U/L (ref 39–117)
BILIRUBIN TOTAL: 1.8 mg/dL — AB (ref 0.3–1.2)
BUN: 43 mg/dL — AB (ref 6–23)
CO2: 17 mmol/L — ABNORMAL LOW (ref 19–32)
CREATININE: 2.94 mg/dL — AB (ref 0.50–1.10)
Calcium: 7.5 mg/dL — ABNORMAL LOW (ref 8.4–10.5)
Chloride: 101 mmol/L (ref 96–112)
GFR calc Af Amer: 22 mL/min — ABNORMAL LOW (ref >90)
GFR, EST NON AFRICAN AMERICAN: 19 mL/min — AB (ref >90)
GLUCOSE: 38 mg/dL — AB (ref 70–99)
Potassium: 5.1 mmol/L (ref 3.5–5.1)
SODIUM: 132 mmol/L — AB (ref 135–145)
Total Protein: 3.5 g/dL — ABNORMAL LOW (ref 6.0–8.3)

## 2015-03-05 LAB — PROTIME-INR: Prothrombin Time: >90 seconds — ABNORMAL HIGH (ref 11.6–15.2)

## 2015-03-05 LAB — URINALYSIS, ROUTINE W REFLEX MICROSCOPIC
Glucose, UA: NEGATIVE mg/dL
Ketones, ur: 15 mg/dL — AB
Nitrite: NEGATIVE
PROTEIN: 30 mg/dL — AB
SPECIFIC GRAVITY, URINE: 1.019 (ref 1.005–1.030)
UROBILINOGEN UA: 0.2 mg/dL (ref 0.0–1.0)
pH: 5 (ref 5.0–8.0)

## 2015-03-05 LAB — RAPID URINE DRUG SCREEN, HOSP PERFORMED
AMPHETAMINES: NOT DETECTED
BENZODIAZEPINES: NOT DETECTED
Barbiturates: NOT DETECTED
COCAINE: NOT DETECTED
Opiates: POSITIVE — AB
Tetrahydrocannabinol: NOT DETECTED

## 2015-03-05 LAB — CBC
HEMATOCRIT: 29.2 % — AB (ref 36.0–46.0)
Hemoglobin: 9.3 g/dL — ABNORMAL LOW (ref 12.0–15.0)
MCH: 39.6 pg — ABNORMAL HIGH (ref 26.0–34.0)
MCHC: 31.8 g/dL (ref 30.0–36.0)
MCV: 124.3 fL — ABNORMAL HIGH (ref 78.0–100.0)
Platelets: 88 10*3/uL — ABNORMAL LOW (ref 150–400)
RBC: 2.35 MIL/uL — ABNORMAL LOW (ref 3.87–5.11)
RDW: 23 % — ABNORMAL HIGH (ref 11.5–15.5)
WBC: 1.5 10*3/uL — ABNORMAL LOW (ref 4.0–10.5)

## 2015-03-05 LAB — URINE MICROSCOPIC-ADD ON

## 2015-03-05 LAB — I-STAT CG4 LACTIC ACID, ED: Lactic Acid, Venous: 16.56 mmol/L (ref 0.5–2.0)

## 2015-03-05 LAB — CBG MONITORING, ED
GLUCOSE-CAPILLARY: 48 mg/dL — AB (ref 70–99)
Glucose-Capillary: 319 mg/dL — ABNORMAL HIGH (ref 70–99)

## 2015-03-05 LAB — ABO/RH: ABO/RH(D): A NEG

## 2015-03-05 LAB — APTT: aPTT: 60 seconds — ABNORMAL HIGH (ref 24–37)

## 2015-03-05 LAB — POC URINE PREG, ED: Preg Test, Ur: NEGATIVE

## 2015-03-05 MED ORDER — FENTANYL CITRATE 0.05 MG/ML IJ SOLN: 100.0000 ug | INTRAMUSCULAR | Status: DC | PRN

## 2015-03-05 MED ORDER — VANCOMYCIN HCL IN DEXTROSE 1-5 GM/200ML-% IV SOLN: 1000.0000 mg | Freq: Once | INTRAVENOUS | Status: DC

## 2015-03-05 MED ORDER — EPINEPHRINE HCL 1 MG/ML IJ SOLN
0.5000 ug/min | INTRAMUSCULAR | Status: DC
  Administered 2015-03-05: 0.6 ug/min via INTRAVENOUS
  Filled 2015-03-05 (×2): qty 4

## 2015-03-05 MED ORDER — SODIUM CHLORIDE 0.9 % IV SOLN
2000.0000 mL | Freq: Once | INTRAVENOUS | Status: AC
  Administered 2015-03-05: 1000 mL via INTRAVENOUS

## 2015-03-05 MED ORDER — NOREPINEPHRINE BITARTRATE 1 MG/ML IV SOLN
0.0000 ug/min | Freq: Once | INTRAVENOUS | Status: AC
  Administered 2015-03-05: 20 ug/min via INTRAVENOUS

## 2015-03-05 MED ORDER — SODIUM CHLORIDE 0.9 % IV SOLN
1000.0000 mL | Freq: Once | INTRAVENOUS | Status: AC
  Administered 2015-03-05: 1000 mL via INTRAVENOUS

## 2015-03-05 MED ORDER — VANCOMYCIN HCL 10 G IV SOLR
2500.0000 mg | Freq: Once | INTRAVENOUS | Status: AC
  Administered 2015-03-05: 2500 mg via INTRAVENOUS
  Filled 2015-03-05: qty 2500

## 2015-03-05 MED ORDER — DEXTROSE 5 % IV SOLN
0.0000 ug/min | INTRAVENOUS | Status: DC
  Administered 2015-03-05: 50 ug/min via INTRAVENOUS
  Filled 2015-03-05 (×2): qty 4

## 2015-03-05 MED ORDER — MIDAZOLAM HCL 2 MG/2ML IJ SOLN: 2.0000 mg | INTRAMUSCULAR | Status: DC | PRN

## 2015-03-05 MED ORDER — DEXTROSE 5 % IV SOLN
Freq: Once | INTRAVENOUS | Status: AC
  Administered 2015-03-05: 08:00:00 via INTRAVENOUS

## 2015-03-05 MED ORDER — SODIUM CHLORIDE 0.9 % IV SOLN: Freq: Once | INTRAVENOUS | Status: AC

## 2015-03-05 MED ORDER — PANTOPRAZOLE SODIUM 40 MG IV SOLR: 40.0000 mg | Freq: Every day | INTRAVENOUS | Status: DC

## 2015-03-05 MED ORDER — ASPIRIN 300 MG RE SUPP: 300.0000 mg | RECTAL | Status: DC

## 2015-03-05 MED ORDER — PIPERACILLIN-TAZOBACTAM 3.375 G IVPB
3.3750 g | Freq: Once | INTRAVENOUS | Status: DC
  Administered 2015-03-05: 3.375 g via INTRAVENOUS
  Filled 2015-03-05: qty 50

## 2015-03-05 MED ORDER — VASOPRESSIN 20 UNIT/ML IV SOLN
0.0300 [IU]/min | INTRAVENOUS | Status: DC
  Administered 2015-03-05: 0.03 [IU]/min via INTRAVENOUS
  Filled 2015-03-05 (×2): qty 2

## 2015-03-05 MED ORDER — SODIUM BICARBONATE 8.4 % IV SOLN
INTRAVENOUS | Status: DC
  Administered 2015-03-05: 11:00:00 via INTRAVENOUS
  Filled 2015-03-05 (×2): qty 150

## 2015-03-05 MED ORDER — DEXTROSE 50 % IV SOLN
INTRAVENOUS | Status: AC
  Administered 2015-03-05: 50 mL
  Filled 2015-03-05: qty 50

## 2015-03-05 MED ORDER — SODIUM CHLORIDE 0.9 % IV SOLN: 1000.0000 mL | INTRAVENOUS | Status: DC

## 2015-03-05 MED FILL — Medication: Qty: 1 | Status: AC

## 2015-03-06 LAB — URINE CULTURE
Colony Count: NO GROWTH
Culture: NO GROWTH

## 2015-03-06 LAB — PREPARE FRESH FROZEN PLASMA
Unit division: 0
Unit division: 0

## 2015-03-06 MED FILL — Medication: Qty: 1 | Status: AC

## 2015-03-07 ENCOUNTER — Telehealth: Payer: Self-pay

## 2015-03-07 LAB — GLUCOSE, CAPILLARY: Glucose-Capillary: 162 mg/dL — ABNORMAL HIGH (ref 70–99)

## 2015-03-07 NOTE — Telephone Encounter (Signed)
Received death certificate from [place] 03/07/15 for cremation.  Taking to Pulmonary to leave for Dr. [place] Bells who will be in this afternoon.  Signed and called for pick-up 03/07/15.

## 2015-03-07 NOTE — Telephone Encounter (Signed)
Noted. Informed Pt and Pt's husband of Dr. Larose Kells recommendations.

## 2015-03-08 LAB — CULTURE, BLOOD (ROUTINE X 2)

## 2015-03-11 ENCOUNTER — Ambulatory Visit: Payer: 59 | Admitting: Internal Medicine

## 2015-03-11 NOTE — Discharge Summary (Signed)
NAME:  Townshend, Asami               ACCOUNT NO.:  [id number]  MEDICAL RECORD NO.:  [id number]  LOCATION:  2H02C                        FACILITY:  [facility]  PHYSICIAN:  Norlene Campbell, MDDATE OF BIRTH:  02-Jun-1975  DATE OF ADMISSION:  03/20/15 DATE OF DISCHARGE:  03/20/15                              DISCHARGE SUMMARY   DEATH SUMMARY  CAUSE OF DEATH: 1. Septic shock. 2. Healthcare-associated pneumonia. 3. Aspiration pneumonia. 4. Autoimmune hepatitis. 5. Morbid obesity.  HISTORY OF PRESENT ILLNESS:  This is a 40 year old female, who was admitted on 20-Mar-2015.  She had multiple severe comorbid illnesses, but had been in her usual state of health the day prior to admission. She presented to the ER early in the morning on 03-20-2015, after she has been found unresponsive by family at home.  EMS was called and en route, she was given D50 for a low blood sugar.  Upon arrival, the patient was noted to be sluggish and confused.  She was sent to the head CT scanner for a head CT, and unfortunately while receiving that procedure, she developed a cardiac arrest.  She had a 20-minute episode of CPR in the [facility] ED and then was transported into the [facility] the [facility] ED.  There, central line was placed and Pulmonary and Critical Care Medicine was consulted.  PHYSICAL EXAMINATION BY PULMONARY AND CRITICAL CARE MEDICINE ON THE MORNING OF ADMISSION:  VITAL SIGNS:  Temperature 98.2, respirations 28, heart rate 28, blood pressure 114/74, SpO2 84%. GENERAL:  Morbidly obese, unresponsive female. NEURO:  Unresponsive, no sedations.  Pupils are dilated. HEENT:  ET tube is in place.  Normocephalic and atraumatic. CARDIOVASCULAR:  Distant regular rate and rhythm. LUNGS:  Coarse rhonchi bilaterally. ABDOMEN:  Massively obese, hypoactive bowel sounds. MUSCULOSKELETAL:  Normal bulk and tone. SKIN:  Cyanotic throughout, no rash.  HOSPITAL COURSE:  The patient was admitted to the  Cardiac Intensive Care Unit for plans with normothermia protocol, temperature control management after 20-minute cardiac arrest in the emergency department. She had profound shock, which was felt to be secondary to septic shock in the setting of aspiration pneumonia.  The underlying cause of the entire illness was never quite clear, but it did seem that she developed severe acute sepsis from pneumonia.  It is possible that this may have been due to aspiration pneumonia in the setting of confusion, which had developed overnight.  The patient was treated aggressively in the intensive care unit, but unfortunately, her blood pressure continued to fall and despite aggressive measures with the ventilator as well as with multiple vasopressor agents.  The patient's family was consulted and after several hours of aggressive resuscitation, they elected to withdraw her care when it became clear that medical care was not medically non- beneficial at this point.  Care was withdrawn on 03-20-15, with the patient's family at the bedside.  She died peacefully within there.          ______________________________ Norlene Campbell, MD     DBM/MEDQ  D:  03/10/2015  T:  03/11/2015  Job:  [id number]

## 2015-03-16 ENCOUNTER — Ambulatory Visit: Payer: Self-pay | Admitting: Cardiovascular Disease

## 2015-03-16 DIAGNOSIS — Z5181 Encounter for therapeutic drug level monitoring: Secondary | ICD-10-CM

## 2015-04-01 NOTE — ED Notes (Signed)
Family at bedside. Updated on pt condition

## 2015-04-01 NOTE — ED Notes (Addendum)
CGB 15 - MD notified

## 2015-04-01 NOTE — ED Notes (Signed)
Pt remains unresponsive on vent , pt has had no purposeful movement , no meds for sedation have been given , MD updated

## 2015-04-01 NOTE — Progress Notes (Signed)
Paged to ED for pt who had coded in scanner.  Found CPR in progress in scan room; husband present just outside door or scanner.  He gave me info on [place]; has autoimmune hepatitis and other problems.  Has hx of medical issues.  Husband very emotional; said their parents were on the way.  I spent time w/ husband as CPR continued.  Loma Sousa helped with this pt.  Family began to arrive.  Pt heartbeat was established but condition very critical.  Further info in Courtney;s notes.  Nelva Bush, [place]      03-07-2015       3:50 PM

## 2015-04-01 NOTE — Progress Notes (Signed)
Patient up to floor from ED. Patient is very mottled, completely unresponsive and unable to doppler a BP at this time. NP Tammy Parrett at bedside and spoke with patients spouse. Patient made a full DNR at this time with no further measures. Spouse at bedside and agreeable. We are leaving only [place] on at this time as they are awaiting one more family members arrival. Will continue to monitor the patient and offer family support, including chaplin. [facility] [facility] notified as well.

## 2015-04-01 NOTE — ED Notes (Signed)
Per [facility]:

## 2015-04-01 NOTE — ED Notes (Signed)
Per [facility]: husband found pt lying on floor next to bed unresponsive around 0530. Husband unsure how long pt was on floor. [facility] found orange mucus in mouth and nose Blood sugar from EMS read "low", pt unresponsive. Gave 1 amp of D50, after CBG read 180. En route pt stopped responding verbally, CBG re-check 67. Pt placed on NRB 15 L/min and nasal airway. Pt tolerated nasal airway. Then pt received 1/2 amp d50, a few minutes later pt was able to respond verbally.   Per [facility]: Pupils appear to be dilated.

## 2015-04-01 NOTE — ED Notes (Signed)
See Code Sheet.

## 2015-04-01 NOTE — ED Notes (Signed)
D5 hanging

## 2015-04-01 NOTE — ED Notes (Signed)
Pt returned to Trauma A, [place], RN, [place], Rapid Response and Dr. Jeanell Sparrow back to the bedside.

## 2015-04-01 NOTE — Procedures (Signed)
Central Venous Catheter Insertion Procedure Note Margaret [name] [id number] 02-19-1975  Procedure: Insertion of Central Venous Catheter Indications: Drug and/or fluid administration  Procedure Details Consent: Unable to obtain consent because of emergent medical necessity. Time Out: Verified patient identification, verified procedure, site/side was marked, verified correct patient position, special equipment/implants available, medications/allergies/relevent history reviewed, required imaging and test results available.  Performed  Maximum sterile technique was used including antiseptics, cap, gloves, gown, hand hygiene, mask and sheet. Skin prep: Chlorhexidine; local anesthetic administered A antimicrobial bonded/coated triple lumen catheter was placed in the right internal jugular vein using the Seldinger technique.  Ultrasound was used to verify the patency of the vein and for real time needle guidance.  Evaluation Blood flow good Complications: No apparent complications Patient did tolerate procedure well. Chest X-ray ordered to verify placement.  CXR: pending.  MCQUAID, DOUGLAS Mar 30, 2015, 9:58 AM

## 2015-04-01 NOTE — ED Notes (Signed)
Critical care at bedside, placing central line

## 2015-04-01 NOTE — ED Notes (Addendum)
Pt HR dropping on monitor to 40s, dusky, not responding to verbal stimuli, pulse check to carotid- pulses absent. CPR started by ED tech. Code blue called by CT staff.

## 2015-04-01 NOTE — Progress Notes (Signed)
Pt transported on vent from CT to ED Trauma Room A.

## 2015-04-01 NOTE — Progress Notes (Signed)
Pt on maximal pressor support.  B/p remains poor and  Unable to obtain peripheral pulses w/ diffuse cyanosis   Remains unresponsive on no sedation , pupils dilated.  O2 sats in 40s on full vent support.  Spoke with family -husband, mother and siblings  Pt now DNR. Does not appear survivable .  Support provided.  No further lab sticks.   Kiaraliz Rafuse NP-C  [facility]  [phone]

## 2015-04-01 NOTE — Progress Notes (Signed)
Chaplain referred to pt and family by overnight on-call chaplain at shift change. Chaplain offered emotional support, provided hospitality, and facilitated information sharing between pt family and medical team. Page chaplain as needed [phone].   2015/03/26 1000  Clinical Encounter Type  Visited With Family  Visit Type ED;Initial;Spiritual support  Referral From Chaplain  Consult/Referral To Chaplain  Spiritual Encounters  Spiritual Needs Emotional  Stress Factors  Family Stress Factors Lack of knowledge  Marcelino Scot 2015-03-26 10:17 AM

## 2015-04-01 NOTE — ED Notes (Signed)
Pt taken to CT with monitor and this RN.

## 2015-04-01 NOTE — Progress Notes (Signed)
Attempted to draw ABG x 2 but unsuccessful. Pt going to CT, will attempt to collect ABG upon return. RT will continue to monitor.

## 2015-04-01 NOTE — ED Notes (Addendum)
2 amps bicarb  Push given  Per critical care MD

## 2015-04-01 NOTE — ED Provider Notes (Addendum)
CSN: [id number]     Arrival date & time 03-29-2015  0713 History   First MD Initiated Contact with Patient 03/29/2015 [id number]     Chief Complaint  Patient presents with  . Hypoglycemia   Level 5 caveat patient altered  (Consider location/radiation/quality/duration/timing/severity/associated sxs/prior Treatment) Patient is a 40 y.o. female presenting with hypoglycemia.  Hypoglycemia  40 y.o. Female with autoimmune hepatitis and liver failure presents today with altered mental status and hypoglycemia per ems.  EMS report husband called after finding on floor last known normal last night when they went to bed.  History was from husband at seen.  He is en route.  Patient discharged mid February after admission for ams with elevated ammonia.   Past Medical History  Diagnosis Date  . DVT (deep venous thrombosis) 1996, 2000    h/o  . GERD (gastroesophageal reflux disease)   . Obesity, morbid   . Anticardiolipin antibody syndrome   . Hepatitis, autoimmune   . Abnormal menses   . Sleep apnea      mild to moderate  per apnea link y 3-12  . Chronic headaches   . Chronic lower back pain     Dr. Vira Blanco   Past Surgical History  Procedure Laterality Date  . Dilation and curettage of uterus  11/2008   Family History  Problem Relation Age of Onset  . Adopted: Yes   History  Substance Use Topics  . Smoking status: Former Smoker -- 0.25 packs/day for 16 years    Types: Cigarettes    Start date: 12/31/1996    Quit date: 06/29/2012  . Smokeless tobacco: Never Used     Comment: QUIT SMOKING 3 years ago  . Alcohol Use: 0.0 oz/week    0 Standard drinks or equivalent per week     Comment: occasionally-3 drinks per weekend   OB History    No data available     Review of Systems  Unable to perform ROS     Allergies  Influenza vaccines  Home Medications   Prior to Admission medications   Medication Sig Start Date End Date Taking? Authorizing Provider  azaTHIOprine (IMURAN) 50 MG tablet  Take 50 mg by mouth daily. Takes 4 tablets daily.    Historical Provider, MD  budesonide (ENTOCORT EC) 3 MG 24 hr capsule Take 3 mg by mouth daily. Takes 3 tabs q AM = 9 mg    Historical Provider, MD  furosemide (LASIX) 40 MG tablet Take 40 mg by mouth daily. 01/08/15 01/08/16  Historical Provider, MD  lactulose (CHRONULAC) 10 GM/15ML solution Take 30 mLs (20 g total) by mouth 2 (two) times daily. Titrate to have around 2 bowel movements a day. 02/13/15   Shanker Kristeen Mans, MD  Multiple Vitamins-Minerals (ZINC PO) Take 1 capsule by mouth daily.    Historical Provider, MD  nystatin (MYCOSTATIN) 100000 UNIT/ML suspension Take 5 mLs (500,000 Units total) by mouth 4 (four) times daily. Swish and swallow 02/16/15   Colon Branch, MD  Oxycodone HCl 10 MG TABS Take 15 mg by mouth 5 (five) times daily as needed. Pt takes 1 1/2 tabs  As needed    Historical Provider, MD  pantoprazole (PROTONIX) 40 MG tablet Take 40 mg by mouth daily. 01/08/15 01/08/16  Historical Provider, MD  potassium chloride (K-DUR) 10 MEQ tablet Take 10 mEq by mouth daily. Take 1/2-1 tablet every night at bedtime as needed    Historical Provider, MD  rifaximin (XIFAXAN) 550 MG TABS tablet Take 1  tablet (550 mg total) by mouth 2 (two) times daily. 02/13/15   Shanker Kristeen Mans, MD  Skin Protectants, Misc. (EUCERIN) cream Apply topically as needed. 01/08/15   Historical Provider, MD  spironolactone (ALDACTONE) 50 MG tablet Take 50 mg by mouth daily.  02/22/15 02/22/16  Historical Provider, MD  Tapentadol HCl (NUCYNTA ER) 100 MG TB12 Take 100 mg by mouth 2 (two) times daily.    Historical Provider, MD  triamcinolone cream (KENALOG) 0.1 % Apply topically 2 (two) times daily as needed. 01/08/15 01/08/16  Historical Provider, MD  warfarin (COUMADIN) 5 MG tablet Take 1.5 to 2 tablets by mouth daily as directed by coumadin clinic Patient taking differently: Take 7.5-10 mg by mouth one time only at 6 PM. Take 7.5mg  on MWF. Take 10mg  the rest of the week 09/14/14   Colon Branch, MD  Zinc 50 MG CAPS Take 1 capsule by mouth 2 (two) times daily.    Historical Provider, MD   SpO2 98% Physical Exam  Constitutional: She appears well-developed.  moridly obese  HENT:  Head: Atraumatic.  Eyes: Pupils are equal, round, and reactive to light.  Neck: Normal range of motion.  Cardiovascular: Normal rate.   Pulmonary/Chest: Effort normal and breath sounds normal.  Abdominal:  Large pannus discolored no skin break down noted.   Neurological:  Patient answers some questions with yes no answers, no spontaneous eye opening.   Skin: Skin is warm and dry.  Nursing note and vitals reviewed.   ED Course  INTUBATION Date/Time: 03-09-15 9:53 AM Performed by: Shaune Pollack Authorized by: Shaune Pollack Consent: The procedure was performed in an emergent situation. Time out: Immediately prior to procedure a "time out" was called to verify the correct patient, procedure, equipment, support staff and site/side marked as required. Indications: respiratory failure,  airway protection and  hypoxemia Intubation method: fiberoptic oral Patient status: unconscious Preoxygenation: BVM Tube size: 8.0 mm Tube type: cuffed Number of attempts: 1 Cricoid pressure: yes Cords visualized: yes Post-procedure assessment: chest rise and ETCO2 monitor Breath sounds: equal and absent over the epigastrium Cuff inflated: yes ETT to lip: 23 cm Tube secured with: ETT holder Chest x-Taffy Delconte interpreted by radiologist. Chest x-Aleksi Brummet findings: endotracheal tube in appropriate position Patient tolerance: Patient tolerated the procedure well with no immediate complications   (including critical care time) Labs Review Labs Reviewed  ETHANOL  PROTIME-INR  APTT  CBC  DIFFERENTIAL  COMPREHENSIVE METABOLIC PANEL  URINE RAPID DRUG SCREEN (HOSP PERFORMED)  URINALYSIS, [employer], URINE  URINE RAPID DRUG SCREEN (HOSP PERFORMED)  ETHANOL  BLOOD GAS, ARTERIAL   AMMONIA  I-STAT CHEM 8, ED  I-STAT TROPOININ, ED  I-STAT TROPOININ, ED  POC URINE PREG, ED    Imaging Review No results found.   EKG Interpretation   Date/Time:  03/09/15 07:24:11 EST Ventricular Rate:  102 PR Interval:  164 QRS Duration: 82 QT Interval:  338 QTC Calculation: 440 R Axis:   96 Text Interpretation:  Sinus tachycardia Rightward axis Low voltage QRS  Nonspecific ST and T wave abnormality Abnormal ECG Confirmed by Rushton Early MD,  Andee Poles ([id number]) on 09-Mar-2015 10:33:50 AM      MDM   Final diagnoses:  Altered mental state    BS 50 d50 given. d5 drip ordered.   Called to CT for code.  Patient pulseless and unresponsive.  See code sheet for cpr.  Patient had epi x 5, bicarb x 2 and intubated with  return of pulses.  Patient continues with unresponsive.  BP 120 by cuff, but no palpable brachial pulse.    CXR with good tube placement with rul infiltrate.  Zosyn and vanc ordered  Discussed with Dr. Lake Bells and family.   Results for orders placed or performed during the hospital encounter of 03/18/2015  Ethanol  Result Value Ref Range   Alcohol, Ethyl (B) <5 0 - 9 mg/dL  Protime-INR  Result Value Ref Range   Prothrombin Time >90.0 (H) 11.6 - 15.2 seconds   INR >10.00 (HH) 0.00 - 1.49  APTT  Result Value Ref Range   aPTT 60 (H) 24 - 37 seconds  CBC  Result Value Ref Range   WBC 1.5 (L) 4.0 - 10.5 K/uL   RBC 2.35 (L) 3.87 - 5.11 MIL/uL   Hemoglobin 9.3 (L) 12.0 - 15.0 g/dL   HCT 29.2 (L) 36.0 - 46.0 %   MCV 124.3 (H) 78.0 - 100.0 fL   MCH 39.6 (H) 26.0 - 34.0 pg   MCHC 31.8 30.0 - 36.0 g/dL   RDW 23.0 (H) 11.5 - 15.5 %   Platelets 88 (L) 150 - 400 K/uL  Differential  Result Value Ref Range   Neutrophils Relative % 79 (H) 43 - 77 %   Lymphocytes Relative 12 12 - 46 %   Monocytes Relative 0 (L) 3 - 12 %   Eosinophils Relative 9 (H) 0 - 5 %   Basophils Relative 0 0 - 1 %   Band Neutrophils 0 0 - 10 %   Metamyelocytes Relative 0 %   Myelocytes  0 %   Promyelocytes Absolute 0 %   Blasts 0 %   nRBC 79 (H) 0 /100 WBC   Neutro Abs 1.2 (L) 1.7 - 7.7 K/uL   Lymphs Abs 0.2 (L) 0.7 - 4.0 K/uL   Monocytes Absolute 0.0 (L) 0.1 - 1.0 K/uL   Eosinophils Absolute 0.1 0.0 - 0.7 K/uL   Basophils Absolute 0.0 0.0 - 0.1 K/uL   RBC Morphology POLYCHROMASIA PRESENT    WBC Morphology INCREASED BANDS (>20% BANDS)   Comprehensive metabolic panel  Result Value Ref Range   Sodium 132 (L) 135 - 145 mmol/L   Potassium 5.1 3.5 - 5.1 mmol/L   Chloride 101 96 - 112 mmol/L   CO2 17 (L) 19 - 32 mmol/L   Glucose, Bld 38 (LL) 70 - 99 mg/dL   BUN 43 (H) 6 - 23 mg/dL   Creatinine, Ser 2.94 (H) 0.50 - 1.10 mg/dL   Calcium 7.5 (L) 8.4 - 10.5 mg/dL   Total Protein 3.5 (L) 6.0 - 8.3 g/dL   Albumin 1.2 (L) 3.5 - 5.2 g/dL   AST 100 (H) 0 - 37 U/L   ALT 32 0 - 35 U/L   Alkaline Phosphatase 53 39 - 117 U/L   Total Bilirubin 1.8 (H) 0.3 - 1.2 mg/dL   GFR calc non Af Amer 19 (L) >90 mL/min   GFR calc Af Amer 22 (L) >90 mL/min   Anion gap 14 5 - 15  Ammonia  Result Value Ref Range   Ammonia 54 (H) 11 - 32 umol/L  I-Stat Chem 8, ED  Result Value Ref Range   Sodium 133 (L) 135 - 145 mmol/L   Potassium 5.1 3.5 - 5.1 mmol/L   Chloride 101 96 - 112 mmol/L   BUN 46 (H) 6 - 23 mg/dL   Creatinine, Ser 2.80 (H) 0.50 - 1.10 mg/dL   Glucose, Bld 33 (  LL) 70 - 99 mg/dL   Calcium, Ion 1.07 (L) 1.12 - 1.23 mmol/L   TCO2 13 0 - 100 mmol/L   Hemoglobin 10.2 (L) 12.0 - 15.0 g/dL   HCT 30.0 (L) 36.0 - 46.0 %   Comment NOTIFIED PHYSICIAN   I-Stat Troponin, ED (not at [facility])  Result Value Ref Range   Troponin i, poc 0.03 0.00 - 0.08 ng/mL   Comment 3          CBG monitoring, ED  Result Value Ref Range   Glucose-Capillary 48 (L) 70 - 99 mg/dL  CBG monitoring, ED  Result Value Ref Range   Glucose-Capillary 319 (H) 70 - 99 mg/dL  I-Stat Chem 8, ED  Result Value Ref Range   Sodium 130 (L) 135 - 145 mmol/L   Potassium 6.5 (HH) 3.5 - 5.1 mmol/L   Chloride 102 96 - 112  mmol/L   BUN 61 (H) 6 - 23 mg/dL   Creatinine, Ser 2.60 (H) 0.50 - 1.10 mg/dL   Glucose, Bld 152 (H) 70 - 99 mg/dL   Calcium, Ion 0.96 (L) 1.12 - 1.23 mmol/L   TCO2 9 0 - 100 mmol/L   Hemoglobin 8.8 (L) 12.0 - 15.0 g/dL   HCT 26.0 (L) 36.0 - 46.0 %   Comment NOTIFIED PHYSICIAN   I-Stat CG4 Lactic Acid, ED  Result Value Ref Range   Lactic Acid, Venous 16.56 (HH) 0.5 - 2.0 mmol/L   Comment NOTIFIED PHYSICIAN    Dg Chest 2 View  02/11/2015   CLINICAL DATA:  Syncopal episode today.  Now feels out of it.  EXAM: CHEST  2 VIEW  COMPARISON:  CT chest 05/30/2009  FINDINGS: Shallow lung inflation. Heart size is accentuated by technique. The lungs are clear. No pulmonary edema.  IMPRESSION: 1. Shallow inflation. 2.  No evidence for acute  abnormality.   Electronically Signed   By: Nolon Nations M.D.   On: 02/11/2015 21:04   Ct Head Wo Contrast  03-18-2015   CLINICAL DATA:  Patient found down. Unresponsive. Hypoglycemia. Cardiac arrest. Initial encounter.  EXAM: CT HEAD WITHOUT CONTRAST  TECHNIQUE: Contiguous axial images were obtained from the base of the skull through the vertex without intravenous contrast.  COMPARISON:  02/11/2015.  FINDINGS: No mass lesion, mass effect, midline shift, hydrocephalus, hemorrhage. No territorial ischemia or acute infarction.  There is no convincing evidence of diffuse cerebral edema in this patient status post CPR. Paranasal sinuses are within normal limits. Nasal trumpet is present, crossing the septum from the RIGHT nostril into the LEFT nasal passageway through perforated septum that was evident on the prior exam.  Dysconjugate gaze incidentally noted. Mild paranasal sinus mucosal thickening. Partial opacification of ethmoid air cells.  IMPRESSION: 1. Negative CT head. 2. RIGHT-sided nasal trumpet crossing the midline through chronically perforated nasal septum.   Electronically Signed   By: Dereck Ligas M.D.   On: 2015/03/18 09:13   Ct Head Wo  Contrast  02/11/2015   CLINICAL DATA:  Syncopal episode earlier today. Altered mental state.  EXAM: CT HEAD WITHOUT CONTRAST  TECHNIQUE: Contiguous axial images were obtained from the base of the skull through the vertex without intravenous contrast.  COMPARISON:  Brain MRI on 01/30/2014  FINDINGS: No evidence of intracranial hemorrhage, brain edema, or other signs of acute infarction. No evidence of intracranial mass lesion or mass effect.  No abnormal extraaxial fluid collections identified. Ventricles are normal in size. No skull abnormality identified.  IMPRESSION: Negative noncontrast head CT.  Electronically Signed   By: Earle Gell M.D.   On: 02/11/2015 22:15   Dg Chest Portable 1 View  03/14/15   CLINICAL DATA:  Hypoglycemia.  CPR.  Post resuscitation radiographs.  EXAM: PORTABLE CHEST - 1 VIEW  COMPARISON:  03-14-2015 at 0737 hours.  FINDINGS: Support apparatus: Endotracheal tube is present with the tip 26 mm from the carina. Enteric tube is present which is doubled back in the inferior chest. This may be within a hiatal hernia or doubled back upon itself in the inferior esophagus. Regardless, the tip is not in the abdomen.  Cardiomediastinal Silhouette: Partially obscured by defibrillator pads. Size difficult to assess due to artifact.  Lungs: Airspace disease has improved substantially. There is still persistent perihilar predominant diffuse airspace disease with focal consolidation in the RIGHT upper lobe. RIGHT upper lobe air bronchograms are present. Differential considerations include persistent pulmonary edema, pneumonia, or aspiration. No pneumothorax.  Effusions:  None.  Other:  None.  IMPRESSION: 1. Endotracheal tube in good position. 2. Enteric tube doubled back upon itself in the inferior chest, probably within the distal thoracic esophagus. 3. Improved airspace disease with persistent RIGHT upper lobe consolidation. Perihilar predominant airspace disease probably represents pulmonary  edema.   Electronically Signed   By: Dereck Ligas M.D.   On: Mar 14, 2015 09:09   Dg Chest Port 1 View  Mar 14, 2015   CLINICAL DATA:  Altered mental status.  Initial encounter.  EXAM: PORTABLE CHEST - 1 VIEW  COMPARISON:  02/11/2015.  FINDINGS: The cardiopericardial silhouette is obscured. Monitoring leads project over the chest. There is diffuse bilateral airspace disease, roughly symmetric. Exam degraded by obese body habitus. There may be a LEFT pleural effusion.  No pneumothorax.  IMPRESSION: 1. Diffuse bilateral airspace disease. Differential considerations are pneumonia or florid pulmonary edema. 2. Cardiopericardial silhouette is mostly obscured but is probably enlarged.   Electronically Signed   By: Dereck Ligas M.D.   On: 2015-03-14 58:67     41 year old female with autoimmune hepatitis, liver failure, on immunosuppressants and on Coumadin for history of DVT presents this a.m. with altered mental status with last known normal at bedtime last night. Patient had a pulmonary arrest while in CT scanner. Intubation and CPR ensued with return of pulses. Chest x-[place] after intubation significant for right upper lobe infiltrate. Blood cultures, Zosyn, and vancomycin ordered. Lactic acid is elevated. We have been unable to obtain arterial blood gases.  Patient with intial cxr with pulmonary edema vs infiltrate, post intubation with clearing except rul infiltrate on plain cxr with  ct chest still pending.  Patient has continued with decreased responsiveness since cpr- no eye opening or response to voice or pain.  She has been breathing over the vent.  Patient has had ongoing glucose checks and is on d5.  Fluid ordered at 3300 cc bolus at 30 cc/kg based on weight of 108 kg.     1- altered mental status-  Hypoglycemia vs hypoperfusion of brain vs sepsis vs stroke-  Ct without evidence of bleeding, no lateralized deficitis noted.  Patient with increased responsiveness after d50, but now continues  unresponsive post cpr with ongoing documented euglycemia to hyperglycemia.  2- sepsis- patient with rul infiltrate identified after intubation.  No fever, sbp greater than 100.  First lactic acid 16.5 (done post cpr) decreasing specificity.  3- anticoagulation- inr >10.  Patient with hgb decreased to 8 from 11- consider reversal.  4- liver failure- ammonia 54   CRITICAL CARE Performed by: Shaune Pollack  Total critical care time: 75 Critical care time was exclusive of separately billable procedures and treating other patients. Critical care was necessary to treat or prevent imminent or life-threatening deterioration. Critical care was time spent personally by me on the following activities: development of treatment plan with patient and/or surrogate as well as nursing, discussions with consultants, evaluation of patient's response to treatment, examination of patient, obtaining history from patient or surrogate, ordering and performing treatments and interventions, ordering and review of laboratory studies, ordering and review of radiographic studies, pulse oximetry and re-evaluation of patient's condition.   Shaune Pollack, MD 03-25-2015 [place] Carrell Palmatier, MD 03/25/2015 1037

## 2015-04-01 NOTE — H&P (Addendum)
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 CCM to admit.   STUDIES:  3/5 CT chest > 3/5 CT head >>neg   SIGNIFICANT EVENTS: Cardiac arrest in CT 3/5     HISTORY OF PRESENT ILLNESS:   Margaret [name] is a 40 y.o. 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 Followed by hematology with leukopenia thrombyocytopenia  Presented to ER 3/5 found at home unresponsive by family , EMS w/ bS noted 50, given D 50 x for persist low sugars. On arrival pt sluggish, cyanotic and altered.  CXR showed diffuse ASPDZ .  CT head neg for acute issues. Pt w/ cardiac arrest in CT chest requiring CPR. ROSC . Very difficult to get pulse, doppler pulse found in neck.  INR on arrival >10  . Started on IVF and pressors w/ levophed . Started IV Abx with vanc and zosyn . Pt unresponsive with dilated pupils on vent , no sedation and cyanotic . PCCM to admit    PAST MEDICAL HISTORY :   has a past medical history of DVT (deep venous thrombosis) (1996, 2000); GERD (gastroesophageal reflux disease); Obesity, morbid; Anticardiolipin antibody syndrome; Hepatitis, autoimmune; Abnormal menses; Sleep apnea; Chronic headaches; and Chronic lower back pain.  has past surgical history that includes Dilation and curettage of uterus (11/2008). Prior to Admission medications   Medication Sig Start Date End Date Taking? Authorizing Provider  azaTHIOprine (IMURAN)  50 MG tablet Take 50 mg by mouth daily. Takes 4 tablets daily.    Historical Provider, MD  budesonide (ENTOCORT EC) 3 MG 24 hr capsule Take 3 mg by mouth daily. Takes 3 tabs q AM = 9 mg    Historical Provider, MD  furosemide (LASIX) 40 MG tablet Take 40 mg by mouth daily. 01/08/15 01/08/16  Historical Provider, MD  lactulose (CHRONULAC) 10 GM/15ML solution Take 30 mLs (20 g total) by mouth 2 (two) times daily. Titrate to have around 2 bowel movements a day. 02/13/15   Shanker Kristeen Mans, MD  Multiple Vitamins-Minerals (ZINC PO) Take 1 capsule by mouth daily.    Historical Provider, MD  nystatin (MYCOSTATIN) 100000 UNIT/ML suspension Take 5 mLs (500,000 Units total) by mouth 4 (four) times daily. Swish and swallow 02/16/15   Colon Branch, MD  Oxycodone HCl 10 MG TABS Take 15 mg by mouth 5 (five) times daily as needed. Pt takes 1 1/2 tabs  As needed    Historical Provider, MD  pantoprazole (PROTONIX) 40 MG tablet Take 40 mg by mouth daily. 01/08/15 01/08/16  Historical Provider, MD  potassium chloride (K-DUR) 10 MEQ tablet Take 10 mEq by mouth daily. Take 1/2-1 tablet every night at bedtime as needed    Historical Provider, MD  rifaximin (XIFAXAN) 550 MG TABS tablet Take 1 tablet (550 mg total) by mouth 2 (two) times daily. 02/13/15   Shanker Kristeen Mans, MD  Skin Protectants, Misc. (EUCERIN) cream Apply topically as needed. 01/08/15   Historical Provider, MD  spironolactone (ALDACTONE) 50 MG tablet Take 50 mg by mouth daily.  02/22/15 02/22/16  Historical Provider, MD  Tapentadol HCl (NUCYNTA ER) 100 MG TB12 Take 100 mg by mouth 2 (two) times daily.    Historical Provider, MD  triamcinolone cream (KENALOG) 0.1 % Apply topically 2 (two) times daily as needed. 01/08/15 01/08/16  Historical Provider, MD  warfarin (COUMADIN) 5 MG tablet Take 1.5 to 2 tablets by mouth daily as directed by coumadin clinic Patient taking differently: Take 7.5-10 mg by mouth one time only at 6 PM. Take 7.5mg  on MWF. Take 10mg  the rest of the week  09/14/14   Colon Branch, MD  Zinc 50 MG CAPS Take 1 capsule by mouth 2 (two) times daily.    Historical Provider, MD   Allergies  Allergen Reactions  . Influenza Vaccines     "Got sick x months"    FAMILY HISTORY:  is adopted. SOCIAL HISTORY:  reports that she quit smoking about 2 years ago. Her smoking use included Cigarettes. She started smoking about 18 years ago. She has a 4 pack-year smoking history. She has never used smokeless tobacco. She reports that she drinks alcohol. She reports that she does not use illicit drugs.  REVIEW OF SYSTEMS:  Unable to obtain on vent /unresonsive   SUBJECTIVE:   VITAL SIGNS: Temp:  [98.2 F (36.8 C)] 98.2 F (36.8 C) (03/05 0729) Pulse Rate:  [28-104] 28 (03/05 0907) Resp:  [23-36] 25 (03/05 0907) BP: (93-118)/(68-77) 114/74 mmHg (03/05 0907) SpO2:  [84 %-100 %] 84 % (03/05 0907) Weight:  [108.863 kg (240 lb)] 108.863 kg (240 lb) (03/05 0909) HEMODYNAMICS:   VENTILATOR SETTINGS:   INTAKE / OUTPUT: No intake or output data in the 24 hours ending 04-04-2015 0927  PHYSICAL EXAMINATION: General:  Morbidly obese unresponsive  Neuro: unresponsive -no sedation, pupils dilated  HEENT:  ETT  Cardiovascular:  Reg, distant heart sounds, doppler pulse in neck  Lungs:  Coarse rhonchi  Abdomen:  Massively obese , hypoactive BS  Musculoskeletal:  Intact  Skin:  Cyanotic , no rash   LABS:  CBC  Recent Labs Lab 04/04/15 0725 04-Apr-2015 0802  WBC 1.5*  --   HGB 9.3* 10.2*  HCT 29.2* 30.0*  PLT 88*  --    Coag's  Recent Labs Lab 03/04/15 1453  INR 4.7   BMET  Recent Labs Lab 04-04-2015 0725 2015-04-04 0802  NA 132* 133*  K 5.1 5.1  CL 101 101  CO2 17*  --   BUN 43* 46*  CREATININE 2.94* 2.80*  GLUCOSE 38* 33*   Electrolytes  Recent Labs Lab 2015/04/04 0725  CALCIUM 7.5*   Sepsis Markers No results for input(s): LATICACIDVEN, PROCALCITON, O2SATVEN in the last 168 hours. ABG No results for input(s): PHART, PCO2ART, PO2ART in  the last 168 hours. Liver Enzymes  Recent Labs Lab 04/04/2015 0725  AST 100*  ALT PENDING  ALKPHOS 53  BILITOT 1.8*  ALBUMIN 1.2*   Cardiac Enzymes No results for input(s): TROPONINI, PROBNP in the last 168 hours. Glucose  Recent Labs Lab 04-04-2015 0722 04-Apr-2015 0831  GLUCAP 48* 319*    Imaging No results found.   ASSESSMENT / PLAN:  PULMONARY OETT 3/5 >> A:Acute Resp Failure s/p cardiac arrest  Bilateral HCAP -?aspiration on narcotic  OSA /?OHS   P:   Admit to ICU  Vent support  ABG  ABX see ID sxn   CARDIOVASCULAR CVL R IJ  3/5 >> A:  Cardiac Arrest 3/5  Shock -suspect secondary to Sepsis/HCAP  P:  Begin Normothermia protocol  Cycle enzymes  Titrate pressors for MAP >80  RENAL A:  Acute on Chronic Renal Failure  Metabolic Acidosis  P:   Add HCO3 drip  Tr BMET   GASTROINTESTINAL A:  Autoimmune Hepatitis on Imuran  P:   PPI   HEMATOLOGIC A:  Recurrent DVT on lifelong coumadin  (anticardiolipin ab syndrome)  Leukopenia /Thrombocytopenia f/by Hematology  Supratherapeutic INR -10  P:  Hold coumadin  ?FFP   INFECTIOUS A:  HCAP ?aspiration  P:   BCx2 3/5 >> UC 3/5 >> Sputum 3/5 >> Abx: Vanc/Zosyan , start date3/5 , day 1/  ENDOCRINE A:  Hypoglycemia  P:   D5 IVF    NEUROLOGIC A:  Altered Mental Status  P:   RASS goal: -1 Versed /Fentanyl as needed.     FAMILY  - Updates:   - Inter-disciplinary family meet or Palliative Care meeting due by:  3/12     TODAY'S SUMMARY:      [facility] NP-C  Pulmonary and [place] Pager: [phone]  03-Apr-2015, 9:27 AM   Attending   I have seen and examined the patient with nurse practitioner/resident and agree with the note above.   Severely ill lady with septic shock from likely massive aspiration pneumonia causing acute respiratory failure with hypoxemia.  CPR 20 minutes.  High risk for anoxic injury  Lungs with rhonchi Cyanotic on  exam  Plan Normothermia protocol IV abx Full vent support, may need ARDS protocol Vasopressors, serial lactic acid  Prognosis guarded  My cc time 60 minutes  Roselie Awkward, MD [place] [facility] Pager: [phone] Cell: [phone] If no response, call [phone]

## 2015-04-01 NOTE — ED Notes (Signed)
Report given to Chad, RN.

## 2015-04-01 NOTE — Progress Notes (Signed)
Code called for pt while in CT. End Tidal was attached once pt was intubated. ETCo2 reading between 19-28. RT will continue to monitor.

## 2015-04-01 DEATH — deceased

## 2015-04-22 ENCOUNTER — Ambulatory Visit: Payer: 59 | Admitting: Hematology & Oncology

## 2015-04-22 ENCOUNTER — Other Ambulatory Visit: Payer: 59 | Admitting: Lab

## 2015-06-16 ENCOUNTER — Other Ambulatory Visit: Payer: 59 | Admitting: Lab

## 2015-06-16 ENCOUNTER — Ambulatory Visit: Payer: 59 | Admitting: Hematology & Oncology

## 2015-08-04 IMAGING — CR DG CHEST 1V PORT
1 series · 1 of 1 positions shown · non-contrast
Comparison: 03/05/2015 at 1747 hours.

CLINICAL DATA: Hypoglycemia.  CPR.  Post resuscitation radiographs.

EXAM:
PORTABLE CHEST - 1 VIEW

[AP]
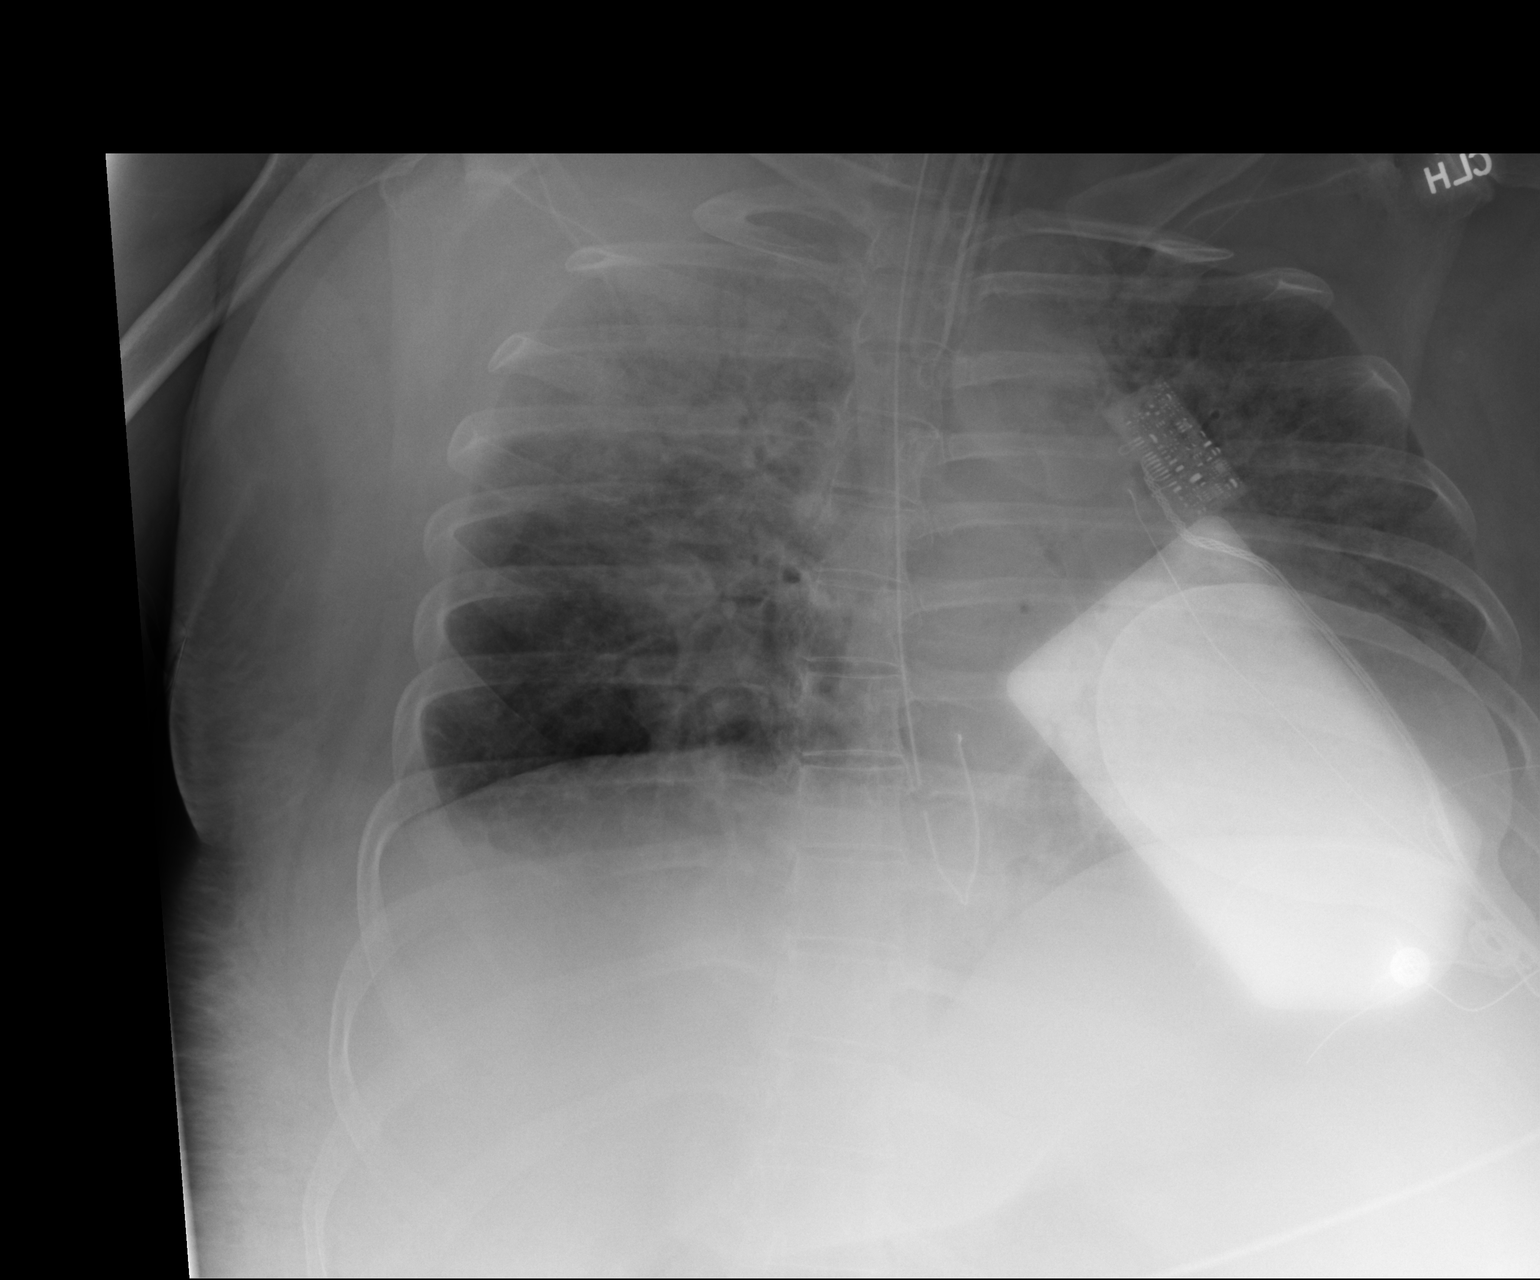

[1 of 1 positions shown; findings below may reference images not displayed]

FINDINGS: Support apparatus: Endotracheal tube is present with the tip 26 mm
from the carina. Enteric tube is present which is doubled back in
the inferior chest. This may be within a hiatal hernia or doubled
back upon itself in the inferior esophagus. Regardless, the tip is
not in the abdomen.

Cardiomediastinal Silhouette: Partially obscured by defibrillator
pads. Size difficult to assess due to artifact.

Lungs: Airspace disease has improved substantially. There is still
persistent perihilar predominant diffuse airspace disease with focal
consolidation in the RIGHT upper lobe. RIGHT upper lobe air
bronchograms are present. Differential considerations include
persistent pulmonary edema, pneumonia, or aspiration. No
pneumothorax.

Effusions:  None.

Other:  None.
IMPRESSION: 1. Endotracheal tube in good position.
2. Enteric tube doubled back upon itself in the inferior chest,
probably within the distal thoracic esophagus.
3. Improved airspace disease with persistent RIGHT upper lobe
consolidation. Perihilar predominant airspace disease probably
represents pulmonary edema.

## 2015-08-04 IMAGING — CT CT CHEST W/O CM
1 of 3 series · 3 of 34 positions shown, 4 images · non-contrast
Comparison: Prior CTA of the chest on 05/30/2009; chest x-ray
earlier today.

CLINICAL DATA: Unresponsive, hypoglycemic, cardiac arrest and
status post CPR.

EXAM:
CT CHEST WITHOUT CONTRAST
TECHNIQUE: Multidetector CT imaging of the chest was performed following the
standard protocol without IV contrast..

[Series 207: sagittals · oblique · 0.50mm/px · 3 of 174 slices shown, 4 images]
[im 1/174  mediastinal]
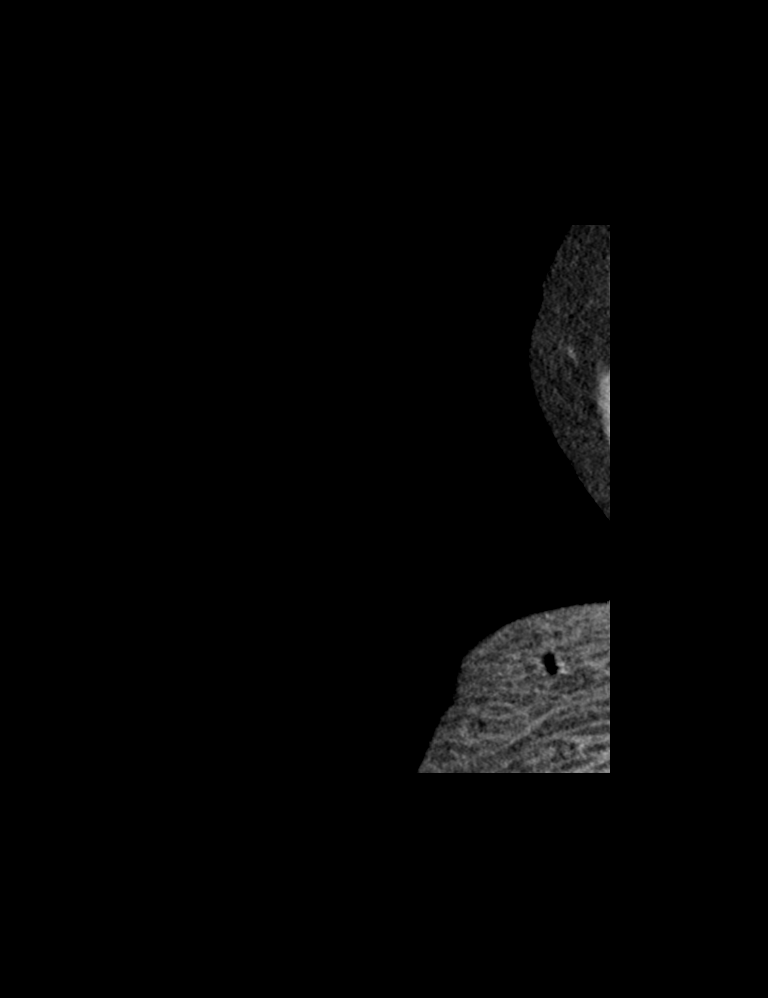
[im 1/174  lung]
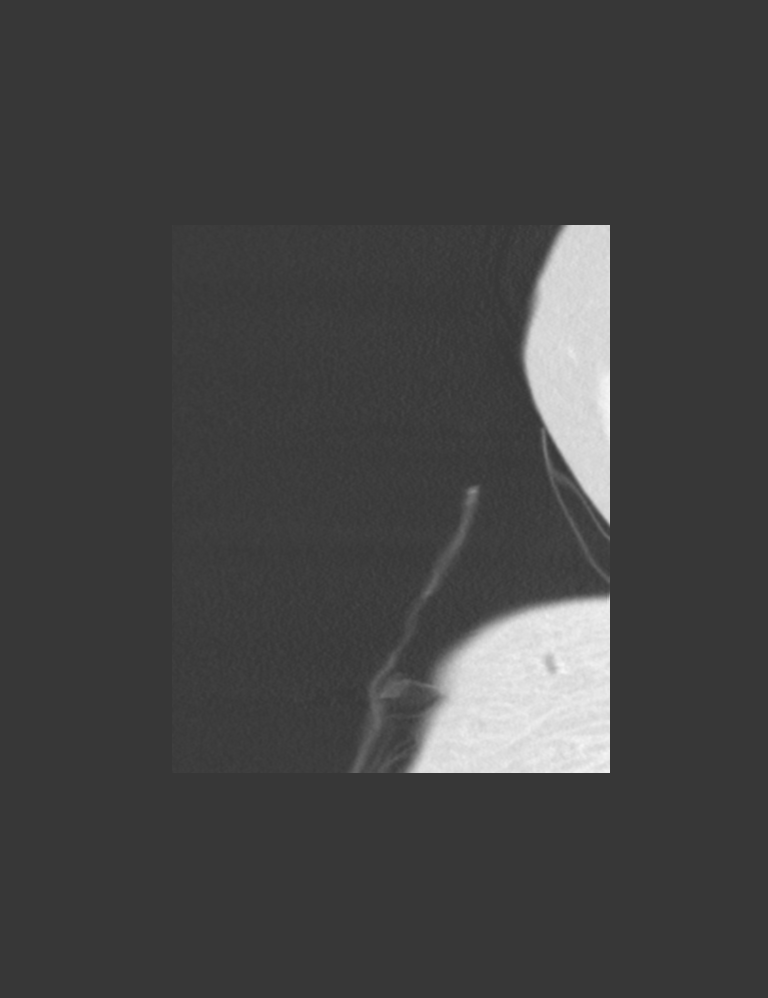
[im 87/174  lung]
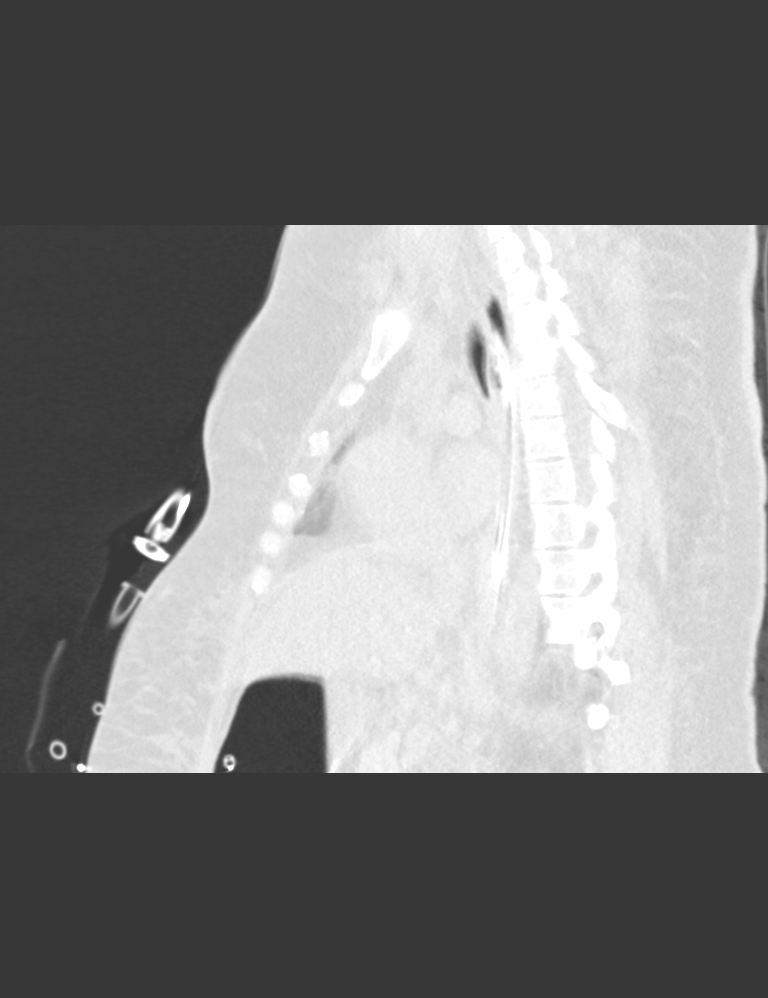
[im 174/174  lung]
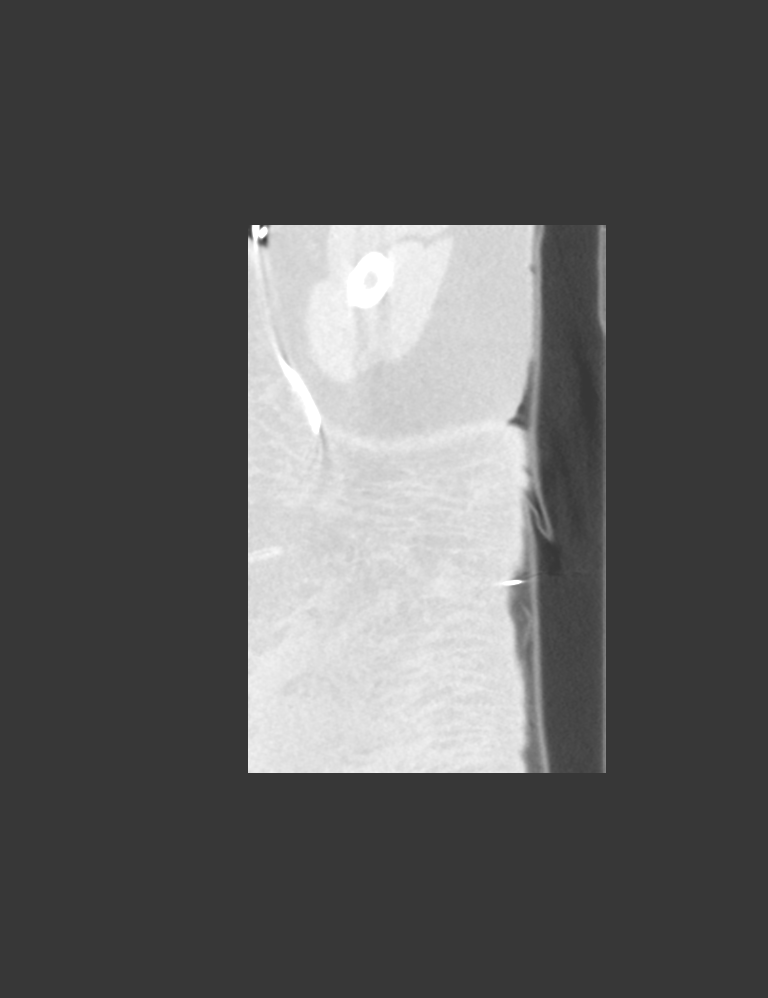

[3 of 34 positions shown; findings below may reference images not displayed]

FINDINGS: Endotracheal tube present with the tip 1.5 cm above the carina.
Lungs show evidence of severe bilateral pulmonary airspace disease
throughout the right upper lobe, left upper lobe, lingula and left
lower lobe. There is milder airspace disease in the right middle
lobe and lower lobe. Findings are likely consistent with pneumonia
and pulmonary edema. The dense nature of airspace consolidation,
especially in the right upper lobe and left lung, suggest also the
potential for massive aspiration.

No plural effusions, pneumothorax or pneumomediastinum. The heart
size is normal and there is no evidence of pericardial fluid. No
lymphadenopathy is identified. No airway obstruction.

Nasogastric tube extends into the stomach. Visualized upper abdomen
shows a small amount of ascites surrounding the liver. Third spacing
of fluid is noted in the subcutaneous fat. No bony abnormalities.
IMPRESSION: Severe bilateral pulmonary airspace disease. Findings likely relate
to a combination of pneumonia and pulmonary edema. Dense nature of
bilateral airspace consolidation may be consistent with massive
aspiration.
